# Patient Record
Sex: Female | Born: 1960 | Race: White | Hispanic: No | Marital: Married | Smoking: Never smoker
Health system: Southern US, Community
[De-identification: ages and names within clinical notes are randomized; demographics above are authoritative.]

## PROBLEM LIST (undated history)

## (undated) DIAGNOSIS — R519 Headache, unspecified: Secondary | ICD-10-CM

## (undated) DIAGNOSIS — F329 Major depressive disorder, single episode, unspecified: Secondary | ICD-10-CM

## (undated) DIAGNOSIS — F32A Depression, unspecified: Secondary | ICD-10-CM

## (undated) DIAGNOSIS — I2699 Other pulmonary embolism without acute cor pulmonale: Secondary | ICD-10-CM

## (undated) DIAGNOSIS — R51 Headache: Secondary | ICD-10-CM

## (undated) DIAGNOSIS — D6862 Lupus anticoagulant syndrome: Secondary | ICD-10-CM

## (undated) DIAGNOSIS — F419 Anxiety disorder, unspecified: Secondary | ICD-10-CM

## (undated) DIAGNOSIS — E119 Type 2 diabetes mellitus without complications: Secondary | ICD-10-CM

## (undated) DIAGNOSIS — F4310 Post-traumatic stress disorder, unspecified: Secondary | ICD-10-CM

## (undated) DIAGNOSIS — G8929 Other chronic pain: Secondary | ICD-10-CM

## (undated) DIAGNOSIS — D6852 Prothrombin gene mutation: Secondary | ICD-10-CM

## (undated) HISTORY — DX: Other chronic pain: G89.29

## (undated) HISTORY — DX: Headache, unspecified: R51.9

## (undated) HISTORY — DX: Headache: R51

## (undated) HISTORY — DX: Major depressive disorder, single episode, unspecified: F32.9

## (undated) HISTORY — DX: Anxiety disorder, unspecified: F41.9

## (undated) HISTORY — DX: Post-traumatic stress disorder, unspecified: F43.10

## (undated) HISTORY — PX: TONSILLECTOMY AND ADENOIDECTOMY: SHX28

## (undated) HISTORY — DX: Other pulmonary embolism without acute cor pulmonale: I26.99

## (undated) HISTORY — PX: OTHER SURGICAL HISTORY: SHX169

## (undated) HISTORY — PX: KNEE ARTHROSCOPY: SHX127

## (undated) HISTORY — DX: Type 2 diabetes mellitus without complications: E11.9

## (undated) HISTORY — DX: Depression, unspecified: F32.A

---

## 1999-08-16 ENCOUNTER — Encounter: Payer: Self-pay | Admitting: Emergency Medicine

## 1999-08-16 ENCOUNTER — Emergency Department (HOSPITAL_COMMUNITY): Admission: EM | Admit: 1999-08-16 | Discharge: 1999-08-16 | Payer: Self-pay | Admitting: Emergency Medicine

## 1999-11-09 ENCOUNTER — Ambulatory Visit (HOSPITAL_COMMUNITY): Admission: RE | Admit: 1999-11-09 | Discharge: 1999-11-09 | Payer: Self-pay | Admitting: Gastroenterology

## 2001-07-19 ENCOUNTER — Encounter: Admission: RE | Admit: 2001-07-19 | Discharge: 2001-09-07 | Payer: Self-pay | Admitting: Rheumatology

## 2002-02-13 ENCOUNTER — Encounter: Admission: RE | Admit: 2002-02-13 | Discharge: 2002-02-13 | Payer: Self-pay | Admitting: Family Medicine

## 2002-02-13 ENCOUNTER — Encounter: Payer: Self-pay | Admitting: Family Medicine

## 2003-10-28 ENCOUNTER — Encounter: Admission: RE | Admit: 2003-10-28 | Discharge: 2003-10-28 | Payer: Self-pay | Admitting: Family Medicine

## 2003-11-03 ENCOUNTER — Encounter: Admission: RE | Admit: 2003-11-03 | Discharge: 2003-11-03 | Payer: Self-pay | Admitting: Family Medicine

## 2005-11-23 ENCOUNTER — Emergency Department (HOSPITAL_COMMUNITY): Admission: EM | Admit: 2005-11-23 | Discharge: 2005-11-23 | Payer: Self-pay | Admitting: Emergency Medicine

## 2005-12-03 ENCOUNTER — Emergency Department (HOSPITAL_COMMUNITY): Admission: EM | Admit: 2005-12-03 | Discharge: 2005-12-03 | Payer: Self-pay | Admitting: Emergency Medicine

## 2005-12-22 ENCOUNTER — Encounter: Admission: RE | Admit: 2005-12-22 | Discharge: 2005-12-22 | Payer: Self-pay | Admitting: Specialist

## 2008-09-01 ENCOUNTER — Emergency Department (HOSPITAL_COMMUNITY): Admission: EM | Admit: 2008-09-01 | Discharge: 2008-09-01 | Payer: Self-pay | Admitting: Emergency Medicine

## 2008-09-02 ENCOUNTER — Encounter: Admission: RE | Admit: 2008-09-02 | Discharge: 2008-09-02 | Payer: Self-pay | Admitting: Family Medicine

## 2008-09-03 ENCOUNTER — Encounter: Admission: RE | Admit: 2008-09-03 | Discharge: 2008-09-03 | Payer: Self-pay | Admitting: Family Medicine

## 2010-11-22 LAB — CBC
HCT: 42.1 % (ref 36.0–46.0)
Hemoglobin: 14.2 g/dL (ref 12.0–15.0)
MCHC: 33.8 g/dL (ref 30.0–36.0)
MCV: 95 fL (ref 78.0–100.0)
Platelets: 244 10*3/uL (ref 150–400)
RDW: 13.5 % (ref 11.5–15.5)

## 2010-11-22 LAB — COMPREHENSIVE METABOLIC PANEL
AST: 29 U/L (ref 0–37)
Albumin: 4.4 g/dL (ref 3.5–5.2)
Calcium: 9.6 mg/dL (ref 8.4–10.5)
Creatinine, Ser: 0.89 mg/dL (ref 0.4–1.2)
GFR calc non Af Amer: >60 mL/min (ref >60)
Glucose, Bld: 98 mg/dL (ref 70–99)
Total Protein: 6.9 g/dL (ref 6.0–8.3)

## 2010-11-22 LAB — DIFFERENTIAL
Basophils Absolute: 0 10*3/uL (ref 0.0–0.1)
Eosinophils Absolute: 0 10*3/uL (ref 0.0–0.7)
Lymphs Abs: 1.2 10*3/uL (ref 0.7–4.0)
Monocytes Absolute: 0.6 10*3/uL (ref 0.1–1.0)
Neutrophils Relative %: 66 % (ref 43–77)

## 2011-02-17 ENCOUNTER — Ambulatory Visit (HOSPITAL_COMMUNITY)
Admission: RE | Admit: 2011-02-17 | Discharge: 2011-02-17 | Disposition: A | Discharge status: Home / Self Care | Payer: 59 | Attending: Psychiatry | Admitting: Psychiatry

## 2011-02-17 DIAGNOSIS — F331 Major depressive disorder, recurrent, moderate: Secondary | ICD-10-CM | POA: Insufficient documentation

## 2011-02-18 ENCOUNTER — Other Ambulatory Visit (HOSPITAL_COMMUNITY): Payer: 59 | Admitting: Psychiatry

## 2011-02-21 ENCOUNTER — Other Ambulatory Visit (HOSPITAL_COMMUNITY): Payer: 59 | Attending: Psychiatry | Admitting: Psychiatry

## 2011-02-21 DIAGNOSIS — G43909 Migraine, unspecified, not intractable, without status migrainosus: Secondary | ICD-10-CM | POA: Insufficient documentation

## 2011-02-21 DIAGNOSIS — K219 Gastro-esophageal reflux disease without esophagitis: Secondary | ICD-10-CM | POA: Insufficient documentation

## 2011-02-21 DIAGNOSIS — Z6379 Other stressful life events affecting family and household: Secondary | ICD-10-CM | POA: Insufficient documentation

## 2011-02-21 DIAGNOSIS — E669 Obesity, unspecified: Secondary | ICD-10-CM | POA: Insufficient documentation

## 2011-02-21 DIAGNOSIS — F4310 Post-traumatic stress disorder, unspecified: Secondary | ICD-10-CM | POA: Insufficient documentation

## 2011-02-22 ENCOUNTER — Other Ambulatory Visit (HOSPITAL_COMMUNITY): Payer: 59 | Attending: Psychiatry | Admitting: Psychiatry

## 2011-02-22 DIAGNOSIS — F4310 Post-traumatic stress disorder, unspecified: Secondary | ICD-10-CM | POA: Insufficient documentation

## 2011-02-22 DIAGNOSIS — IMO0002 Reserved for concepts with insufficient information to code with codable children: Secondary | ICD-10-CM | POA: Insufficient documentation

## 2011-02-22 DIAGNOSIS — Z6379 Other stressful life events affecting family and household: Secondary | ICD-10-CM | POA: Insufficient documentation

## 2011-02-22 DIAGNOSIS — G43909 Migraine, unspecified, not intractable, without status migrainosus: Secondary | ICD-10-CM | POA: Insufficient documentation

## 2011-02-22 DIAGNOSIS — E669 Obesity, unspecified: Secondary | ICD-10-CM | POA: Insufficient documentation

## 2011-02-22 DIAGNOSIS — K219 Gastro-esophageal reflux disease without esophagitis: Secondary | ICD-10-CM | POA: Insufficient documentation

## 2011-02-23 ENCOUNTER — Other Ambulatory Visit (HOSPITAL_COMMUNITY): Payer: 59 | Admitting: Psychiatry

## 2011-02-24 ENCOUNTER — Other Ambulatory Visit (HOSPITAL_COMMUNITY): Payer: 59 | Admitting: Psychiatry

## 2011-02-25 ENCOUNTER — Other Ambulatory Visit (HOSPITAL_COMMUNITY): Payer: 59 | Admitting: Psychiatry

## 2011-02-28 ENCOUNTER — Other Ambulatory Visit (HOSPITAL_COMMUNITY): Payer: 59 | Admitting: Psychiatry

## 2011-03-01 ENCOUNTER — Other Ambulatory Visit (HOSPITAL_COMMUNITY): Payer: 59 | Admitting: Psychiatry

## 2011-03-02 ENCOUNTER — Other Ambulatory Visit (HOSPITAL_COMMUNITY): Payer: 59 | Admitting: Psychiatry

## 2011-03-03 ENCOUNTER — Other Ambulatory Visit (HOSPITAL_COMMUNITY): Payer: 59 | Admitting: Psychiatry

## 2011-03-04 ENCOUNTER — Other Ambulatory Visit (HOSPITAL_COMMUNITY): Payer: 59 | Admitting: Psychiatry

## 2011-03-07 ENCOUNTER — Other Ambulatory Visit (HOSPITAL_COMMUNITY): Payer: 59 | Admitting: Psychiatry

## 2011-03-09 ENCOUNTER — Other Ambulatory Visit (HOSPITAL_COMMUNITY): Payer: 59 | Admitting: Psychiatry

## 2011-03-11 ENCOUNTER — Other Ambulatory Visit (HOSPITAL_COMMUNITY): Payer: 59 | Admitting: Psychiatry

## 2011-03-14 ENCOUNTER — Other Ambulatory Visit (HOSPITAL_COMMUNITY): Payer: 59 | Admitting: Psychiatry

## 2011-03-16 ENCOUNTER — Other Ambulatory Visit (HOSPITAL_COMMUNITY): Payer: 59 | Admitting: Psychiatry

## 2011-03-18 ENCOUNTER — Other Ambulatory Visit (HOSPITAL_COMMUNITY): Payer: 59 | Admitting: Psychiatry

## 2011-03-21 ENCOUNTER — Other Ambulatory Visit (HOSPITAL_COMMUNITY): Payer: 59 | Admitting: Psychiatry

## 2011-03-23 ENCOUNTER — Other Ambulatory Visit (HOSPITAL_COMMUNITY): Payer: 59 | Admitting: Psychiatry

## 2011-03-25 ENCOUNTER — Other Ambulatory Visit (HOSPITAL_COMMUNITY): Payer: 59 | Admitting: Psychiatry

## 2011-03-28 ENCOUNTER — Other Ambulatory Visit (HOSPITAL_COMMUNITY): Payer: 59 | Admitting: Psychiatry

## 2011-03-30 ENCOUNTER — Other Ambulatory Visit (HOSPITAL_COMMUNITY): Payer: 59 | Admitting: Psychiatry

## 2011-04-01 ENCOUNTER — Other Ambulatory Visit (HOSPITAL_COMMUNITY): Payer: 59 | Admitting: Psychiatry

## 2011-04-04 ENCOUNTER — Other Ambulatory Visit (HOSPITAL_COMMUNITY): Payer: 59 | Admitting: Psychiatry

## 2011-04-09 DIAGNOSIS — I2699 Other pulmonary embolism without acute cor pulmonale: Secondary | ICD-10-CM

## 2011-04-09 HISTORY — DX: Other pulmonary embolism without acute cor pulmonale: I26.99

## 2011-05-13 LAB — PULMONARY FUNCTION TEST

## 2011-06-14 ENCOUNTER — Other Ambulatory Visit: Payer: Self-pay | Admitting: Rheumatology

## 2011-06-14 DIAGNOSIS — M545 Low back pain, unspecified: Secondary | ICD-10-CM

## 2011-06-15 ENCOUNTER — Other Ambulatory Visit: Payer: Self-pay | Admitting: Rheumatology

## 2011-06-15 ENCOUNTER — Other Ambulatory Visit: Payer: 59

## 2011-06-15 DIAGNOSIS — M533 Sacrococcygeal disorders, not elsewhere classified: Secondary | ICD-10-CM

## 2011-06-17 ENCOUNTER — Ambulatory Visit
Admission: RE | Admit: 2011-06-17 | Discharge: 2011-06-17 | Disposition: A | Discharge status: Home / Self Care | Payer: 59 | Source: Ambulatory Visit | Attending: Rheumatology | Admitting: Rheumatology

## 2011-06-17 DIAGNOSIS — M533 Sacrococcygeal disorders, not elsewhere classified: Secondary | ICD-10-CM

## 2011-07-14 ENCOUNTER — Encounter: Payer: Self-pay | Admitting: Emergency Medicine

## 2011-07-14 ENCOUNTER — Emergency Department (HOSPITAL_COMMUNITY): Admission: EM | Admit: 2011-07-14 | Payer: 59 | Source: Home / Self Care

## 2011-07-14 ENCOUNTER — Emergency Department (HOSPITAL_COMMUNITY)
Admission: EM | Admit: 2011-07-14 | Discharge: 2011-07-15 | Disposition: A | Discharge status: Home / Self Care | Payer: 59 | Attending: Emergency Medicine | Admitting: Emergency Medicine

## 2011-07-14 ENCOUNTER — Ambulatory Visit (HOSPITAL_COMMUNITY)
Admission: RE | Admit: 2011-07-14 | Discharge: 2011-07-14 | Disposition: A | Discharge status: Home / Self Care | Payer: 59 | Source: Ambulatory Visit | Attending: Rheumatology | Admitting: Rheumatology

## 2011-07-14 ENCOUNTER — Other Ambulatory Visit (HOSPITAL_COMMUNITY): Payer: Self-pay | Admitting: Physician Assistant

## 2011-07-14 DIAGNOSIS — Z7901 Long term (current) use of anticoagulants: Secondary | ICD-10-CM | POA: Insufficient documentation

## 2011-07-14 DIAGNOSIS — Z86711 Personal history of pulmonary embolism: Secondary | ICD-10-CM | POA: Insufficient documentation

## 2011-07-14 DIAGNOSIS — R0602 Shortness of breath: Secondary | ICD-10-CM

## 2011-07-14 DIAGNOSIS — J90 Pleural effusion, not elsewhere classified: Secondary | ICD-10-CM | POA: Insufficient documentation

## 2011-07-14 DIAGNOSIS — R059 Cough, unspecified: Secondary | ICD-10-CM | POA: Insufficient documentation

## 2011-07-14 DIAGNOSIS — Z86718 Personal history of other venous thrombosis and embolism: Secondary | ICD-10-CM | POA: Insufficient documentation

## 2011-07-14 DIAGNOSIS — M545 Low back pain, unspecified: Secondary | ICD-10-CM

## 2011-07-14 DIAGNOSIS — R079 Chest pain, unspecified: Secondary | ICD-10-CM | POA: Insufficient documentation

## 2011-07-14 DIAGNOSIS — J9819 Other pulmonary collapse: Secondary | ICD-10-CM | POA: Insufficient documentation

## 2011-07-14 DIAGNOSIS — R05 Cough: Secondary | ICD-10-CM | POA: Insufficient documentation

## 2011-07-14 HISTORY — DX: Prothrombin gene mutation: D68.52

## 2011-07-14 HISTORY — DX: Lupus anticoagulant syndrome: D68.62

## 2011-07-14 LAB — COMPREHENSIVE METABOLIC PANEL
Calcium: 9.3 mg/dL (ref 8.4–10.5)
GFR calc Af Amer: 71 mL/min — ABNORMAL LOW (ref >90)
GFR calc non Af Amer: 61 mL/min — ABNORMAL LOW (ref >90)
Potassium: 3.2 mEq/L — ABNORMAL LOW (ref 3.5–5.1)
Total Bilirubin: 0.3 mg/dL (ref 0.3–1.2)

## 2011-07-14 LAB — DIFFERENTIAL
Basophils Absolute: 0 10*3/uL (ref 0.0–0.1)
Eosinophils Relative: 2 % (ref 0–5)
Lymphocytes Relative: 24 % (ref 12–46)
Monocytes Relative: 8 % (ref 3–12)
Neutro Abs: 3.7 10*3/uL (ref 1.7–7.7)

## 2011-07-14 LAB — CBC
HCT: 38.1 % (ref 36.0–46.0)
Hemoglobin: 12.8 g/dL (ref 12.0–15.0)
MCH: 30 pg (ref 26.0–34.0)
MCV: 89.2 fL (ref 78.0–100.0)
RDW: 13.7 % (ref 11.5–15.5)
WBC: 5.7 10*3/uL (ref 4.0–10.5)

## 2011-07-14 LAB — PROTIME-INR: INR: 1.88 — ABNORMAL HIGH (ref 0.00–1.49)

## 2011-07-14 MED ORDER — IOHEXOL 300 MG/ML  SOLN
100.0000 mL | Freq: Once | INTRAMUSCULAR | Status: AC | PRN
  Administered 2011-07-14: 80 mL via INTRAVENOUS

## 2011-07-14 MED ORDER — ONDANSETRON HCL 4 MG/2ML IJ SOLN
4.0000 mg | Freq: Once | INTRAMUSCULAR | Status: AC
  Administered 2011-07-14: 4 mg via INTRAVENOUS
  Filled 2011-07-14: qty 2

## 2011-07-14 MED ORDER — ALBUTEROL SULFATE (5 MG/ML) 0.5% IN NEBU
5.0000 mg | INHALATION_SOLUTION | Freq: Once | RESPIRATORY_TRACT | Status: AC
  Administered 2011-07-14: 5 mg via RESPIRATORY_TRACT
  Filled 2011-07-14: qty 1

## 2011-07-14 MED ORDER — MORPHINE SULFATE 4 MG/ML IJ SOLN: 4.0000 mg | Freq: Once | INTRAMUSCULAR | Status: DC

## 2011-07-14 MED ORDER — IPRATROPIUM BROMIDE 0.02 % IN SOLN
0.5000 mg | Freq: Once | RESPIRATORY_TRACT | Status: AC
  Administered 2011-07-14: 0.5 mg via RESPIRATORY_TRACT
  Filled 2011-07-14: qty 2.5

## 2011-07-14 MED ORDER — MORPHINE SULFATE 2 MG/ML IJ SOLN
INTRAMUSCULAR | Status: AC
  Administered 2011-07-14: 2 mg via INTRAVENOUS
  Filled 2011-07-14: qty 2

## 2011-07-14 NOTE — ED Provider Notes (Signed)
History     CSN: [id number] Arrival date & time: 07/14/2011  9:16 PM   None     Chief Complaint  Patient presents with  . Shortness of Breath    HPI  History provided by the patient and family. Patient with significant history of lupus, pulmonary embolism, and asthma who presents with complaints of shortness of breath, and chest pain that began yesterday. Patient called PCP and was seen and evaluated today in the office for symptoms. Patient states that symptoms had a similar presentation to her prior pulmonary embolism. Pain is located in the right lower front chest and side as well as the left lateral chest side. Pain is achy. Symptoms are worse with deep breath. Patient has slight cough and shortness of breath. Her PCP was concerned and sent her here for a CT scan of her chest. After receiving the scan patient was advised to come to the emergency room for further evaluation of symptoms. She has no other significant past medical history.    Past Medical History  Diagnosis Date  . Asthma   . 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 Lupus anticoagulant syndrome     Past Surgical History  Procedure Date  . Tonsillectomy and adenoidectomy     History reviewed. No pertinent family history.  History  Substance Use Topics  . Smoking status: Not on file  . Smokeless tobacco: Not on file  . Alcohol Use:     OB History    Grav Para Term Preterm Abortions TAB SAB Ect Mult Living                  Review of Systems  Constitutional: Negative for fever and chills.  Respiratory: Positive for cough and shortness of breath.   Cardiovascular: Positive for chest pain.  Gastrointestinal: Positive for nausea. Negative for vomiting, abdominal pain, diarrhea and constipation.  Genitourinary: Negative for dysuria, frequency, hematuria, flank pain, vaginal bleeding and vaginal discharge.  All other systems reviewed and are negative.    Allergies  Geodon and Vicodin  Home Medications    Current Outpatient Rx  Name Route Sig Dispense Refill  . BACLOFEN 10 MG PO TABS Oral Take 10 mg by mouth daily.      Marland Kitchen CLONAZEPAM 1 MG PO TABS Oral Take 1 mg by mouth at bedtime as needed. For sleep.     Marland Kitchen OMEPRAZOLE 40 MG PO CPDR Oral Take 40 mg by mouth daily.      . QUETIAPINE FUMARATE 100 MG PO TABS Oral Take 100 mg by mouth at bedtime.      . SOLIFENACIN SUCCINATE 10 MG PO TABS Oral Take 10 mg by mouth daily.      . SUMATRIPTAN SUCCINATE 100 MG PO TABS Oral Take 100 mg by mouth every 2 (two) hours as needed. For migraines.     . TOPIRAMATE 200 MG PO TABS Oral Take 200 mg by mouth daily.      . TRAZODONE HCL 300 MG PO TB24 Oral Take 1 tablet by mouth daily.      . WARFARIN SODIUM 5 MG PO TABS Oral Take 5 mg by mouth daily.        BP 116/76  Pulse 81  Temp(Src) 98 F (36.7 C) (Oral)  Resp 16  Wt 200 lb (90.719 kg)  SpO2 99%  Physical Exam  Nursing note and vitals reviewed. Constitutional: She is oriented to person, place, and time. She appears well-developed and well-nourished. No distress.  HENT:  Head:  Normocephalic.  Mouth/Throat: Oropharynx is clear and moist.  Eyes: Conjunctivae and EOM are normal.  Neck: Normal range of motion. Neck supple.  Cardiovascular: Normal rate.   Pulmonary/Chest: Effort normal and breath sounds normal. No respiratory distress. She has no wheezes. She has no rales. She exhibits no tenderness.  Abdominal: Soft. Bowel sounds are normal. There is tenderness in the right upper quadrant. There is no rebound, no guarding, no CVA tenderness, no tenderness at McBurney's point and negative Murphy's sign.  Musculoskeletal: She exhibits no edema and no tenderness.  Neurological: She is alert and oriented to person, place, and time.  Skin: Skin is warm. No rash noted.  Psychiatric: She has a normal mood and affect. Her behavior is normal.    ED Course  Procedures (including critical care time)  Labs Reviewed  PROTIME-INR - Abnormal; Notable for the  following:    Prothrombin Time 21.9 (*)    INR 1.88 (*)    All other components within normal limits  COMPREHENSIVE METABOLIC PANEL - Abnormal; Notable for the following:    Potassium 3.2 (*)    GFR calc non Af Amer 61 (*)    GFR calc Af Amer 71 (*)    All other components within normal limits  CBC  DIFFERENTIAL  LIPASE, BLOOD   Results for orders placed during the hospital encounter of 07/14/11  CBC      Component Value Range   WBC 5.7  4.0 - 10.5 (K/uL)   RBC 4.27  3.87 - 5.11 (MIL/uL)   Hemoglobin 12.8  12.0 - 15.0 (g/dL)   HCT 09.8  11.9 - 14.7 (%)   MCV 89.2  78.0 - 100.0 (fL)   MCH 30.0  26.0 - 34.0 (pg)   MCHC 33.6  30.0 - 36.0 (g/dL)   RDW 82.9  56.2 - 13.0 (%)   Platelets 193  150 - 400 (K/uL)  DIFFERENTIAL      Component Value Range   Neutrophils Relative 66  43 - 77 (%)   Neutro Abs 3.7  1.7 - 7.7 (K/uL)   Lymphocytes Relative 24  12 - 46 (%)   Lymphs Abs 1.3  0.7 - 4.0 (K/uL)   Monocytes Relative 8  3 - 12 (%)   Monocytes Absolute 0.5  0.1 - 1.0 (K/uL)   Eosinophils Relative 2  0 - 5 (%)   Eosinophils Absolute 0.1  0.0 - 0.7 (K/uL)   Basophils Relative 0  0 - 1 (%)   Basophils Absolute 0.0  0.0 - 0.1 (K/uL)  PROTIME-INR      Component Value Range   Prothrombin Time 21.9 (*) 11.6 - 15.2 (seconds)   INR 1.88 (*) 0.00 - 1.49   COMPREHENSIVE METABOLIC PANEL      Component Value Range   Sodium 138  135 - 145 (mEq/L)   Potassium 3.2 (*) 3.5 - 5.1 (mEq/L)   Chloride 106  96 - 112 (mEq/L)   CO2 23  19 - 32 (mEq/L)   Glucose, Bld 97  70 - 99 (mg/dL)   BUN 15  6 - 23 (mg/dL)   Creatinine, Ser 8.65  0.50 - 1.10 (mg/dL)   Calcium 9.3  8.4 - 78.4 (mg/dL)   Total Protein 7.1  6.0 - 8.3 (g/dL)   Albumin 3.8  3.5 - 5.2 (g/dL)   AST 10  0 - 37 (U/L)   ALT 9  0 - 35 (U/L)   Alkaline Phosphatase 88  39 - 117 (U/L)   Total  Bilirubin 0.3  0.3 - 1.2 (mg/dL)   GFR calc non Af Amer 61 (*) >90 (mL/min)   GFR calc Af Amer 71 (*) >90 (mL/min)  LIPASE, BLOOD      Component  Value Range   Lipase 20  11 - 59 (U/L)      Ct Angio Chest W/cm &/or Wo Cm  07/14/2011  *RADIOLOGY REPORT*  Clinical Data:  Progressive shortness of breath.  History of pulmonary embolism.  CT ANGIOGRAPHY CHEST WITH CONTRAST  Technique:  Multidetector CT imaging of the chest was performed using the standard protocol during bolus administration of intravenous contrast.  Multiplanar CT image reconstructions including MIPs were obtained to evaluate the vascular anatomy.  Contrast: 80mL OMNIPAQUE IOHEXOL 300 MG/ML IV SOLN  Comparison:  Chest radiographs 06/09/2007.  No prior chest CT.  Findings:  The pulmonary arteries are well opacified with contrast. There is no evidence of acute pulmonary embolism.  The thoracic aorta appears normal.  There are no enlarged mediastinal or hilar lymph nodes. A small hiatal hernia is noted.  Minimal pleural fluid is present posteriorly at the right costophrenic angle.  There is no left pleural or pericardial effusion.  Mild linear atelectasis is present in both lower lobes and in the right middle lobe.  There is no confluent airspace opacity, endobronchial lesion or suspicious nodule.  The visualized upper abdomen appears unremarkable.  There are no acute osseous findings.  Review of the MIP images confirms the above findings.  IMPRESSION:  1.  No evidence of acute pulmonary embolism. 2.  Mild bibasilar atelectasis and small right pleural effusion.  Original Report Authenticated By: Gerrianne Scale, M.D.     1. Shortness of breath       MDM  9:50 PM patient seen and evaluated. Patient no acute distress.   Pt continues to do well in no respiratory distress and good O2 sats.  Pt's labs, and ECG have been unremarkable.  Pt had no signs for PE on CT angio done earlier today.  At this time pt is felt safe to return home to continue follow up with PCP.  Pt agrees with plan.    Date: 07/14/2011  Rate: 73  Rhythm: normal sinus rhythm  QRS Axis: normal  Intervals:  normal  ST/T Wave abnormalities: normal  Conduction Disutrbances:none  Narrative Interpretation:   Old EKG Reviewed: none available    Angus Seller, PA 07/15/11 1921

## 2011-07-14 NOTE — ED Notes (Signed)
Pt alert, nad, presents to ED, c/o SOB, onset yesterday, seen PCP, had CT tonight, instructed to come to Ed after CT exam, resp even unlabored

## 2011-07-15 LAB — POCT I-STAT TROPONIN I: Troponin i, poc: 0 ng/mL (ref 0.00–0.08)

## 2011-07-15 MED ORDER — METHYLPREDNISOLONE SODIUM SUCC 125 MG IJ SOLR
125.0000 mg | Freq: Once | INTRAMUSCULAR | Status: AC
  Administered 2011-07-15: 125 mg via INTRAVENOUS
  Filled 2011-07-15: qty 2

## 2011-07-18 NOTE — ED Provider Notes (Signed)
Medical screening examination/treatment/procedure(s) were performed by non-physician practitioner and as supervising physician I was immediately available for consultation/collaboration.   Liv Rallis A. Patrica Duel, MD 07/18/11 [phone]

## 2011-07-26 ENCOUNTER — Encounter: Payer: Self-pay | Admitting: Pulmonary Disease

## 2011-07-27 ENCOUNTER — Ambulatory Visit (INDEPENDENT_AMBULATORY_CARE_PROVIDER_SITE_OTHER): Payer: 59 | Admitting: Internal Medicine

## 2011-07-27 ENCOUNTER — Encounter: Payer: Self-pay | Admitting: Internal Medicine

## 2011-07-27 VITALS — BP 100/68 | HR 97 | Temp 98.7°F | Ht 65.0 in | Wt 205.0 lb

## 2011-07-27 DIAGNOSIS — I2699 Other pulmonary embolism without acute cor pulmonale: Secondary | ICD-10-CM

## 2011-07-27 DIAGNOSIS — R0989 Other specified symptoms and signs involving the circulatory and respiratory systems: Secondary | ICD-10-CM

## 2011-07-27 DIAGNOSIS — R0602 Shortness of breath: Secondary | ICD-10-CM

## 2011-07-27 DIAGNOSIS — R0600 Dyspnea, unspecified: Secondary | ICD-10-CM

## 2011-07-27 DIAGNOSIS — R0609 Other forms of dyspnea: Secondary | ICD-10-CM

## 2011-07-27 NOTE — Progress Notes (Signed)
  Subjective:    Patient ID: Allison [name], female    DOB: Oct 25, 1960, 50 y.o.   MRN: [id number]  HPI  65 yowf never smoker, never trouble with breathing as a child, new onset  Sob 04/2011  eval by [facility] Chest early September "on a Friday" and by Saturday assoc with with cp dx pe> admitted Kathryne Sharper some better by discharge then lost ground in terms of sob  and referred by Dr Collins [place] 07/27/2011 for second opinion.  07/14/11 eval in [facility] ER with nl labs, no evidence PE on ct angiogram, very small R effusion  07/27/2011 1st pulmonary eval for sob not present sitting, not sleeping, doe x 100 yards but highly variable,  Assoc with hoarseness, not reproducible in office on day of ov, assoc with minimal dry cough and dysphagia despite ppi daily. No assoc focal leg swelling/ pain or dx of dvt  Sleeping ok without nocturnal  or early am exacerbation  of respiratory  c/o's or need for noct saba. Also denies any obvious fluctuation of symptoms with weather or environmental changes or other aggravating or alleviating factors except as outlined above    pft's and echo at [facility] per pt  proaire or neb > no better.    Review of Systems  Constitutional: Negative for fever, chills and unexpected weight change.  HENT: Positive for trouble swallowing. Negative for ear pain, nosebleeds, congestion, sore throat, rhinorrhea, sneezing, dental problem, voice change, postnasal drip and sinus pressure.   Eyes: Negative for visual disturbance.  Respiratory: Positive for cough and shortness of breath. Negative for choking.   Cardiovascular: Positive for chest pain and leg swelling.  Gastrointestinal: Positive for abdominal pain. Negative for vomiting and diarrhea.  Genitourinary: Negative for difficulty urinating.  Musculoskeletal: Negative for arthralgias.  Skin: Negative for rash.  Neurological: Positive for headaches. Negative for tremors and syncope.  Hematological: Does not bruise/bleed easily.    Psychiatric/Behavioral: The patient is nervous/anxious.        Objective:   Physical Exam amb obese hoarse wf nad with classic pseudowheeze and very flat affect  Wt 205 07/27/11  HEENT: nl dentition, turbinates, and orophanx. Nl external ear canals without cough reflex   NECK :  without JVD/Nodes/TM/ nl carotid upstrokes bilaterally   LUNGS: no acc muscle use, clear to A and P bilaterally without cough on insp or exp maneuvers   CV:  RRR  no s3 or murmur or increase in P2, no edema   ABD:  soft and nontender with nl excursion in the supine position. No bruits or organomegaly, bowel sounds nl  MS:  warm without deformities, calf tenderness, cyanosis or clubbing  SKIN: warm and dry without lesions    NEURO:  alert, approp, no deficits         Assessment & Plan:

## 2011-07-27 NOTE — Patient Instructions (Addendum)
Continue omeprazole 40 mg dosed 30-60 min before eating  Pepcid ac 20 mg one at bedtime  GERD (REFLUX)  is an extremely common cause of respiratory symptoms, many times with no significant heartburn at all.    It can be treated with medication, but also with lifestyle changes including avoidance of late meals, excessive alcohol, smoking cessation, and avoid fatty foods, chocolate, peppermint, colas, red wine, and acidic juices such as orange juice.  NO MINT OR MENTHOL PRODUCTS SO NO COUGH DROPS  USE SUGARLESS CANDY INSTEAD (jolley ranchers or Stover's)  NO OIL BASED VITAMINS - use powdered substitutes.    Build up to exercising  30 min daily pace yourself whereas short of breath never of out breath.   Please schedule a follow up office visit in 4 weeks, sooner if needed

## 2011-07-28 DIAGNOSIS — I2699 Other pulmonary embolism without acute cor pulmonale: Secondary | ICD-10-CM | POA: Insufficient documentation

## 2011-07-28 DIAGNOSIS — R0600 Dyspnea, unspecified: Secondary | ICD-10-CM | POA: Insufficient documentation

## 2011-07-28 NOTE — Assessment & Plan Note (Addendum)
-  07/27/11 Walked RA x 3 laps @ 185 ft each stopped due to  End of study, no tachycardia or desat and nl spirometry > 4 hour p B2 and p exercise   Symptoms are markedly disproportionate to objective findings and not clear this is a lung problem but pt does appear to have difficult airway management issues.   Anxiety is usually a dx of exclusion but is the leading suspect here, along with obesity and deconditioning  ? Acid or nonacid reflux> always difficult to exclude so rx max acid suppression plus diet/ reviewed  Next step full cpst if not improving

## 2011-07-28 NOTE — Assessment & Plan Note (Signed)
Dx in [place] 04/2011   - CT [facility]  07/14/11  1. No evidence of acute pulmonary embolism.  2. Mild bibasilar atelectasis and small right pleural effusion.  No evidence of PAH or ongoing dvt/pe on coumadin rx per Dr Yehuda Budd

## 2011-08-29 ENCOUNTER — Encounter: Payer: 59 | Admitting: Internal Medicine

## 2011-08-29 NOTE — Progress Notes (Signed)
  Subjective:    Patient ID: Allison [name], female    DOB: Jul 01, 1961, 51 y.o.   MRN: [id number]  HPI  63 yowf never smoker, never trouble with breathing as a child, new onset  Sob 04/2011  eval by [facility] Chest early September "on a Friday" and by Saturday assoc with with cp dx pe> admitted Kathryne Sharper some better by discharge then lost ground in terms of sob  and referred by Dr Collins Scotland 07/27/2011 for second opinion.  07/14/11 eval in [facility] with nl labs, no evidence PE on ct angiogram, very small R effusion  07/27/2011 1st pulmonary eval for sob not present sitting, not sleeping, doe x 100 yards but highly variable,  Assoc with hoarseness, not reproducible in office on day of ov, assoc with minimal dry cough and dysphagia despite ppi daily. No assoc focal leg swelling/ pain or dx of dvt  pft's and echo at [facility] per pt  proaire or neb > no better.   rec Continue omeprazole 40 mg dosed 30-60 min before eating  Pepcid ac 20 mg one at bedtime  GERD     Objective:   Physical Exam amb obese hoarse wf nad with classic pseudowheeze and very flat affect  Wt 205 07/27/11 > 08/29/2011   HEENT: nl dentition, turbinates, and orophanx. Nl external ear canals without cough reflex   NECK :  without JVD/Nodes/TM/ nl carotid upstrokes bilaterally   LUNGS: no acc muscle use, clear to A and P bilaterally without cough on insp or exp maneuvers   CV:  RRR  no s3 or murmur or increase in P2, no edema   ABD:  soft and nontender with nl excursion in the supine position. No bruits or organomegaly, bowel sounds nl  MS:  warm without deformities, calf tenderness, cyanosis or clubbing  SKIN: warm and dry without lesions    NEURO:  alert, approp, no deficits         Assessment & Plan:

## 2011-09-15 ENCOUNTER — Ambulatory Visit (INDEPENDENT_AMBULATORY_CARE_PROVIDER_SITE_OTHER): Payer: 59 | Admitting: Internal Medicine

## 2011-09-15 ENCOUNTER — Encounter: Payer: Self-pay | Admitting: Internal Medicine

## 2011-09-15 VITALS — BP 110/72 | HR 74 | Temp 97.9°F | Ht 65.0 in | Wt 205.8 lb

## 2011-09-15 DIAGNOSIS — I2699 Other pulmonary embolism without acute cor pulmonale: Secondary | ICD-10-CM

## 2011-09-15 DIAGNOSIS — R0600 Dyspnea, unspecified: Secondary | ICD-10-CM

## 2011-09-15 DIAGNOSIS — R0609 Other forms of dyspnea: Secondary | ICD-10-CM

## 2011-09-15 DIAGNOSIS — R0989 Other specified symptoms and signs involving the circulatory and respiratory systems: Secondary | ICD-10-CM

## 2011-09-15 NOTE — Progress Notes (Signed)
  Subjective:    Patient ID: Allison [name], female    DOB: 06-12-1961    MRN: [id number]  HPI  33 yowf never smoker, never trouble with breathing as a child, new onset  Sob 04/2011  eval by [facility] Chest early September "on a Friday" and by Saturday assoc with with cp dx pe> admitted Kathryne Sharper some better by discharge then lost ground in terms of sob  and referred by Dr Collins Scotland 07/27/2011 for second opinion.  07/14/11 eval in [facility] with nl labs, no evidence PE on ct angiogram, very small R effusion  07/27/2011 1st pulmonary eval for sob not present sitting, not sleeping, doe x 100 yards but highly variable,  Assoc with hoarseness, not reproducible in office on day of ov, assoc with minimal dry cough and dysphagia despite ppi daily. No assoc focal leg swelling/ pain or dx of dvt pft's and echo at [facility] per pt proaire or neb > no better.  rec Continue omeprazole 40 mg dosed 30-60 min before eating Pepcid ac 20 mg one at bedtime GERD diet  09/15/2011 f/u ov/Allison [name] cc no change in doe x 100 yards, still variable some better with clonazepam. No cough.  Sleeping ok without nocturnal  or early am exacerbation  of respiratory  c/o's or need for noct saba. Also denies any obvious fluctuation of symptoms with weather or environmental changes or other aggravating or alleviating factors except as outlined above   ROS  At present neg for  any significant sore throat, dysphagia, itching, sneezing,  nasal congestion or excess/ purulent secretions,  fever, chills, sweats, unintended wt loss, pleuritic or exertional cp, hempoptysis, orthopnea pnd or leg swelling.  Also denies presyncope, palpitations, heartburn, abdominal pain, nausea, vomiting, diarrhea  or change in bowel or urinary habits, dysuria,hematuria,  rash, arthralgias, visual complaints, headache, numbness weakness or ataxia.         Objective:   Physical Exam amb obese hoarse wf nad with classic pseudowheeze and very flat affect  Wt  205 07/27/11 >  09/15/11 205  HEENT: nl dentition, turbinates, and orophanx. Nl external ear canals without cough reflex   NECK :  without JVD/Nodes/TM/ nl carotid upstrokes bilaterally   LUNGS: no acc muscle use, clear to A and P bilaterally without cough on insp or exp maneuvers   CV:  RRR  no s3 or murmur or increase in P2, no edema   ABD:  soft and nontender with nl excursion in the supine position. No bruits or organomegaly, bowel sounds nl  MS:  warm without deformities, calf tenderness, cyanosis or clubbing     cxr 06/14/11 1. No evidence of acute pulmonary embolism.  2. Mild bibasilar atelectasis and small right pleural effusion.       Assessment & Plan:

## 2011-09-15 NOTE — Patient Instructions (Signed)
Please see patient coordinator before you leave today  to schedule echocardiogram to be sure your heart has recovered from the blood clots  Let Dr Nolen Mu know if your breathing gets worse with tapering your clonazepam  If your echocardiogram is ok the next step in the workup is a formal exercise test at our [facility] street office - I will schedule this after I have a chance to look at your echo first

## 2011-09-18 NOTE — Assessment & Plan Note (Signed)
Spirometry p exercise wnl 07/28/2011  - 09/15/2011  Walked RA x 3 laps @ 185 ft each stopped due to  End of study, no desa  Not able to reproduce her symptoms here in the office strongly suggesting this is anxiety related > next step is formal cpst  See instructions for specific recommendations which were reviewed directly with the patient who was given a copy with highlighter outlining the key components.

## 2011-09-18 NOTE — Assessment & Plan Note (Signed)
CT neg PE 07/14/11 but she could still have TEPAH so rec echo to be complete

## 2011-09-21 ENCOUNTER — Other Ambulatory Visit (HOSPITAL_COMMUNITY): Payer: Self-pay | Admitting: Radiology

## 2011-09-21 DIAGNOSIS — R0600 Dyspnea, unspecified: Secondary | ICD-10-CM

## 2011-09-22 ENCOUNTER — Other Ambulatory Visit: Payer: Self-pay

## 2011-09-22 ENCOUNTER — Ambulatory Visit (HOSPITAL_COMMUNITY): Payer: 59 | Attending: Cardiology | Admitting: Radiology

## 2011-09-22 DIAGNOSIS — R0609 Other forms of dyspnea: Secondary | ICD-10-CM | POA: Insufficient documentation

## 2011-09-22 DIAGNOSIS — R0600 Dyspnea, unspecified: Secondary | ICD-10-CM

## 2011-09-22 DIAGNOSIS — Z86711 Personal history of pulmonary embolism: Secondary | ICD-10-CM | POA: Insufficient documentation

## 2011-09-22 DIAGNOSIS — R0602 Shortness of breath: Secondary | ICD-10-CM

## 2011-09-22 DIAGNOSIS — R0989 Other specified symptoms and signs involving the circulatory and respiratory systems: Secondary | ICD-10-CM | POA: Insufficient documentation

## 2011-09-23 ENCOUNTER — Encounter: Payer: Self-pay | Admitting: Internal Medicine

## 2011-09-23 ENCOUNTER — Telehealth: Payer: Self-pay | Admitting: Internal Medicine

## 2011-09-23 NOTE — Telephone Encounter (Signed)
Dr. Sherene Sires, I called Allison [name] and notified her of ECHO results and to stay on warfarin. She states that her PCP, Herb Grays was thinking of switching warfarin to xarelto. She wanted to see if this is okay with you first. Please advise, thanks!

## 2011-09-23 NOTE — Telephone Encounter (Signed)
Spoke with pt and notified okay per MW to change to the xarelto. Pt verbalized understanding and nothing further needed.

## 2011-09-23 NOTE — Progress Notes (Signed)
Quick Note:  Spoke with pt and notified of results per Dr. Wert. Pt verbalized understanding and denied any questions.  ______ 

## 2011-09-23 NOTE — Telephone Encounter (Signed)
Ok to change  to [employer]

## 2012-08-15 ENCOUNTER — Ambulatory Visit (HOSPITAL_COMMUNITY)
Admission: RE | Admit: 2012-08-15 | Discharge: 2012-08-15 | Disposition: A | Discharge status: Home / Self Care | Payer: 59 | Attending: Psychiatry | Admitting: Psychiatry

## 2012-08-15 DIAGNOSIS — F4310 Post-traumatic stress disorder, unspecified: Secondary | ICD-10-CM | POA: Insufficient documentation

## 2012-08-15 DIAGNOSIS — F332 Major depressive disorder, recurrent severe without psychotic features: Secondary | ICD-10-CM | POA: Insufficient documentation
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
## 2012-08-23 ENCOUNTER — Encounter (HOSPITAL_COMMUNITY): Payer: Self-pay

## 2012-08-23 ENCOUNTER — Other Ambulatory Visit (HOSPITAL_COMMUNITY): Payer: 59 | Attending: Psychiatry | Admitting: Psychiatry

## 2012-08-23 DIAGNOSIS — F4310 Post-traumatic stress disorder, unspecified: Secondary | ICD-10-CM | POA: Insufficient documentation

## 2012-08-23 DIAGNOSIS — F329 Major depressive disorder, single episode, unspecified: Secondary | ICD-10-CM

## 2012-08-23 DIAGNOSIS — F331 Major depressive disorder, recurrent, moderate: Secondary | ICD-10-CM

## 2012-08-23 DIAGNOSIS — F332 Major depressive disorder, recurrent severe without psychotic features: Secondary | ICD-10-CM | POA: Insufficient documentation

## 2012-08-23 DIAGNOSIS — F411 Generalized anxiety disorder: Secondary | ICD-10-CM | POA: Insufficient documentation

## 2012-08-23 DIAGNOSIS — F32A Depression, unspecified: Secondary | ICD-10-CM
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
## 2012-08-24 ENCOUNTER — Other Ambulatory Visit (HOSPITAL_COMMUNITY): Payer: 59 | Admitting: Psychiatry

## 2012-08-24 DIAGNOSIS — F329 Major depressive disorder, single episode, unspecified: Secondary | ICD-10-CM

## 2012-08-24 DIAGNOSIS — F32A Depression, unspecified: Secondary | ICD-10-CM

## 2012-08-24 NOTE — Progress Notes (Signed)
    Daily Group Progress Note  Program: IOP  Group Time: 10:30 am - 12:00 pm   Participation Level: Active  Behavioral Response: Appropriate  Type of Therapy:  Process Group  Summary of Progress:Today was patients first day in the group. She was attentive and observed the group process.     Group Time: 10:30 am - 12:00 pm   Participation Level:  Active  Behavioral Response: Appropriate  Type of Therapy: Psycho-education Group  Summary of Progress: Patient learned a stress management tool (heartmath) and how to use it to reduce stress and maintain wellness.  Carman Ching, LCSW

## 2012-08-24 NOTE — Progress Notes (Signed)
    Daily Group Progress Note  Program: IOP  Group Time: 9:00-10:30 am   Participation Level: Active  Behavioral Response: Appropriate  Type of Therapy:  Process Group  Summary of Progress: Patient participated in a goodbye ceremony for two members ending the group today and expressed feelings associated with them leaving the group and expressed how they had both impacted patient during their time in the program.  Patient learned the skill of healthy boundary setting and how to set healthy limits with others to ensure their own personal wellness.      Group Time: 10:30 am - 12:00 pm   Participation Level:  Active  Behavioral Response: Appropriate  Type of Therapy: Psycho-education Group  Summary of Progress:  Patient learned the skill of healthy boundary setting and how to set healthy limits with others to ensure their own personal wellness.   Carman Ching, LCSW

## 2012-08-27 ENCOUNTER — Other Ambulatory Visit (HOSPITAL_COMMUNITY): Payer: 59 | Admitting: Psychiatry

## 2012-08-27 DIAGNOSIS — F329 Major depressive disorder, single episode, unspecified: Secondary | ICD-10-CM

## 2012-08-27 DIAGNOSIS — F32A Depression, unspecified: Secondary | ICD-10-CM

## 2012-08-27 NOTE — Progress Notes (Signed)
    Daily Group Progress Note  Program: IOP  Group Time: 9:00-10:30 am   Participation Level: Active  Behavioral Response: Appropriate  Type of Therapy:  Process Group  Summary of Progress: Patient reports high depression but has yet to talk in the group. She is struggling to feel comfortable opening up in the group and was encouraged to try sharing her stressors. Patient is struggling with assertiveness and has low self-esteem that prevents her from feeling comfortable asserting her needs. This is a focus of therapy at this time to help her to share.      Group Time: 10:30 am - 12:00 pm   Participation Level:  Active  Behavioral Response: Appropriate  Type of Therapy: Psycho-education Group  Summary of Progress: Patient participated in a group on grief and loss facilitated by Theda Belfast and identified healthy ways of grieving.  Carman Ching, LCSW

## 2012-08-28 ENCOUNTER — Other Ambulatory Visit (HOSPITAL_COMMUNITY): Payer: 59 | Admitting: Psychiatry

## 2012-08-28 DIAGNOSIS — F329 Major depressive disorder, single episode, unspecified: Secondary | ICD-10-CM

## 2012-08-28 DIAGNOSIS — F32A Depression, unspecified: Secondary | ICD-10-CM
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
## 2012-08-29 ENCOUNTER — Other Ambulatory Visit (HOSPITAL_COMMUNITY): Payer: 59 | Admitting: Psychiatry

## 2012-08-29 DIAGNOSIS — F32A Depression, unspecified: Secondary | ICD-10-CM

## 2012-08-29 DIAGNOSIS — F329 Major depressive disorder, single episode, unspecified: Secondary | ICD-10-CM

## 2012-08-29 MED ORDER — CHLORPROMAZINE HCL 10 MG PO TABS: 10.0000 mg | ORAL_TABLET | Freq: Every day | ORAL | Status: DC

## 2012-08-29 NOTE — Progress Notes (Signed)
    Daily Group Progress Note  Program: IOP  Group Time: 9:00-10:30 am   Participation Level: None  Behavioral Response: Resistant  Type of Therapy:  Process Group  Summary of Progress: Patient said she arrived to the group at 7:30 am today. She observed the group, but did not talk. She struggles to express her needs without being asked. This has become a goal of treatment to practice expressing feelings and assertive her needs.      Group Time: 10:30 am - 12:00 pm   Participation Level:  None  Behavioral Response: Resistant  Type of Therapy: Psycho-education Group  Summary of Progress: Patient learned the skill of Mindfulness and how to use it to be less emotionally reactive.   Carman Ching, LCSW
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
## 2012-08-30 ENCOUNTER — Other Ambulatory Visit (HOSPITAL_COMMUNITY): Payer: 59 | Admitting: Psychiatry

## 2012-08-30 DIAGNOSIS — F32A Depression, unspecified: Secondary | ICD-10-CM

## 2012-08-30 DIAGNOSIS — F329 Major depressive disorder, single episode, unspecified: Secondary | ICD-10-CM
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
## 2012-08-31 ENCOUNTER — Other Ambulatory Visit (HOSPITAL_COMMUNITY): Payer: 59 | Admitting: Psychiatry

## 2012-08-31 DIAGNOSIS — F329 Major depressive disorder, single episode, unspecified: Secondary | ICD-10-CM

## 2012-08-31 DIAGNOSIS — F32A Depression, unspecified: Secondary | ICD-10-CM
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
## 2012-09-03 ENCOUNTER — Other Ambulatory Visit (HOSPITAL_COMMUNITY): Payer: 59 | Admitting: Psychiatry

## 2012-09-03 DIAGNOSIS — F32A Depression, unspecified: Secondary | ICD-10-CM

## 2012-09-03 DIAGNOSIS — F329 Major depressive disorder, single episode, unspecified: Secondary | ICD-10-CM

## 2012-09-03 MED ORDER — CHLORPROMAZINE HCL 10 MG PO TABS: 20.0000 mg | ORAL_TABLET | Freq: Every day | ORAL | Status: DC

## 2012-09-03 NOTE — Patient Instructions (Signed)
Pt completed MH-IOP today.  Will follow up with Dr. Nolen Mu on 10-01-12 and Areta Haber, RNC on 09-04-12 @ 8am.  Encouraged support groups.

## 2012-09-03 NOTE — Progress Notes (Signed)
    Daily Group Progress Note  Program: IOP  Group Time: 9:00-10:30 am   Participation Level: Minimal  Behavioral Response: Appropriate  Type of Therapy:  Process Group  Summary of Progress: Today was patients final day in the group. She was quiet and only spoke when encouraged to share, but she participated in her goodbye ceremony and heard positive comments from the other group members wishing her well. She agreed to continue attending the aftercare group every Thursday's for additional support.      Group Time: 10:30 am - 12:00 pm   Participation Level:  Active  Behavioral Response: Appropriate  Type of Therapy: Psycho-education Group  Summary of Progress: patient participated in a group facilitated by Theda Belfast on Grief and loss and and identified current losses impacting wellness and ways to appropriately grieve them.   Carman Ching, LCSW
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
## 2012-09-04 ENCOUNTER — Other Ambulatory Visit (HOSPITAL_COMMUNITY): Payer: 59

## 2012-09-05 ENCOUNTER — Other Ambulatory Visit (HOSPITAL_COMMUNITY): Payer: 59

## 2012-09-06 ENCOUNTER — Other Ambulatory Visit (HOSPITAL_COMMUNITY): Payer: 59

## 2012-09-07 ENCOUNTER — Other Ambulatory Visit (HOSPITAL_COMMUNITY): Payer: 59

## 2012-09-10 ENCOUNTER — Other Ambulatory Visit (HOSPITAL_COMMUNITY): Payer: 59

## 2012-09-11 ENCOUNTER — Other Ambulatory Visit (HOSPITAL_COMMUNITY): Payer: 59

## 2012-09-12 ENCOUNTER — Other Ambulatory Visit (HOSPITAL_COMMUNITY): Payer: 59

## 2012-09-13 ENCOUNTER — Other Ambulatory Visit (HOSPITAL_COMMUNITY): Payer: 59

## 2013-01-01 ENCOUNTER — Ambulatory Visit (INDEPENDENT_AMBULATORY_CARE_PROVIDER_SITE_OTHER): Payer: 59 | Admitting: Internal Medicine

## 2013-01-01 ENCOUNTER — Encounter: Payer: Self-pay | Admitting: Internal Medicine

## 2013-01-01 VITALS — BP 120/66 | HR 83 | Temp 98.0°F | Ht 65.0 in | Wt 190.8 lb

## 2013-01-01 DIAGNOSIS — R0989 Other specified symptoms and signs involving the circulatory and respiratory systems: Secondary | ICD-10-CM

## 2013-01-01 DIAGNOSIS — R0600 Dyspnea, unspecified: Secondary | ICD-10-CM

## 2013-01-01 DIAGNOSIS — R0609 Other forms of dyspnea: Secondary | ICD-10-CM

## 2013-01-01 NOTE — Patient Instructions (Addendum)
Add pepcid 20 mg one at bedtime for at least a month to see if any of your am problems are better  Call if night time cough not improving call Libby at [phone] and she will schedule a sinus CT  To get the most out of exercise, you need to be continuously aware that you are short of breath, but never out of breath, for 30 minutes daily. As you improve, it will actually be easier for you to do the same amount of exercise  in  30 minutes so always push to the level where you are short of breath.   Please see patient coordinator before you leave today  to schedule CPST with spirometry before and after

## 2013-01-01 NOTE — Addendum Note (Signed)
Addended by: Darrell Jewel on: 01/01/2013 05:12 PM   Modules accepted: Orders

## 2013-01-01 NOTE — Assessment & Plan Note (Signed)
07/27/11 Walked RA x 3 laps @ 185 ft each stopped due to  End of study, no tachycardia or desat Spirometry p exercise wnl 07/28/2011  - 09/15/2011  Walked RA x 3 laps @ 185 ft each stopped due to  End of study, no desat - 01/01/2013  Walked RA x 3 laps @ 185 ft each stopped due to slt dizzy no desat or tachycardia  Symptoms are markedly disproportionate to objective findings and not clear this is a lung problem but pt does appear to have difficult airway management issues.  DDX of  difficult airways managment all start with A and  include Adherence, Ace Inhibitors, Acid Reflux, Active Sinus Disease, Alpha 1 Antitripsin deficiency, Anxiety masquerading as Airways dz,  ABPA,  allergy(esp in young), Aspiration (esp in elderly), Adverse effects of DPI,  Active smokers, plus two Bs  = Bronchiectasis and Beta blocker use..and one C= CHF  Anxiety is usually a dx of exclusion but near the top of the list here as the only medication that has helped is clonazepam > continue  ? Allergy/ asthma > no worse off symbicort > no better on neb > d/c  ? Acid reflux > assoc with hoarseness, worse cough at hs and in am > trial max acid suppression/ diet and then cpst  ? Active sinus dz > if cough continues then next step is sinus ct

## 2013-01-01 NOTE — Progress Notes (Signed)
Subjective:    Patient ID: NEIMA LACROSS, female    DOB: 12/12/1960    MRN: [id number]  HPI  34 yowf never smoker, never trouble with breathing as a child, new onset  Sob 04/2011  eval by [facility] Chest early September "on a Friday" and by Saturday assoc with with cp dx pe> admitted Kathryne Sharper some better by discharge then lost ground in terms of sob  and referred by Dr Collins Scotland 07/27/2011 for second opinion.  07/14/11 eval in [facility] ER with nl labs, no evidence PE on ct angiogram, very small R effusion  07/27/2011 1st pulmonary eval for sob not present sitting, not sleeping, doe x 100 yards but highly variable,  Assoc with hoarseness, not reproducible in office on day of ov, assoc with minimal dry cough and dysphagia despite ppi daily. No assoc focal leg swelling/ pain or dx of dvt pft's and echo at [facility] oK per pt proaire or neb > no better.  rec Continue omeprazole 40 mg dosed 30-60 min before eating Pepcid ac 20 mg one at bedtime GERD diet  09/15/2011 f/u ov/Latrel Szymczak cc no change in doe x 100 yards, still variable some better with clonazepam. No cough. rec Please see patient coordinator before you leave today  to schedule echocardiogram to be sure your heart has recovered from the blood clots Let Dr Nolen Mu know if your breathing gets worse with tapering your clonazepam If your echocardiogram is ok the next step in the workup is a formal exercise test at our [facility] street office - I will schedule this after I have a chance to look at your echo first.   01/01/2013 ov/ Butch Otterson re PE now on xarelto/ no longer on pepcid at hs Chief Complaint  Patient presents with  . Acute Visit    increased SOB at rest and with exertion over the past month, wheezing, chest tightness/pain, and cough with yellow mucus at times.   doe progressively worse x one year, nowconsistently reproducible with more than a slow walk, feels like worse activity is shower in am and dressing and feels like she's going to pass  out. Voice never improved on omeprazole and not on pepcid - neb albuterol doesn't help, also perceives hear racing.  Cardiac w/u with indwelling monitor ongoing but neg so far. Cough is hs and in am but minimally productive  Sleeping ok without nocturnal  or early am exacerbation  of respiratory  c/o's or need for noct saba. Also denies any obvious fluctuation of symptoms with weather or environmental changes or other aggravating or alleviating factors except as outlined above   No obvious daytime variabilty or asso cp or chest tightness, subjective wheeze overt sinus or hb symptoms. No unusual exp hx or h/o childhood pna/ asthma or premature birth to her knowledge.   Current Medications, Allergies, Past Medical History, Past Surgical History, Family History, and Social History were reviewed in [employer] record.  ROS  The following are not active complaints unless bolded sore throat, dysphagia, dental problems, itching, sneezing,  nasal congestion or excess/ purulent secretions, ear ache,   fever, chills, sweats, unintended wt loss, pleuritic or exertional cp, hemoptysis,  orthopnea pnd or leg swelling, presyncope, palpitations, heartburn, abdominal pain, anorexia, nausea, vomiting, diarrhea  or change in bowel or urinary habits, change in stools or urine, dysuria,hematuria,  rash, arthralgias, visual complaints, headache, numbness weakness or ataxia or problems with walking or coordination,  change in mood/affect or memory.  Objective:   Physical Exam amb anxious obese hoarse wf nad    Wt 205 07/27/11 >  09/15/11 205 > 190 01/01/2013   HEENT: nl dentition, turbinates, and orophanx. Nl external ear canals without cough reflex   NECK :  without JVD/Nodes/TM/ nl carotid upstrokes bilaterally   LUNGS: no acc muscle use, clear to A and P bilaterally without cough on insp or exp maneuvers   CV:  RRR  no s3 or murmur or increase in P2, no edema   ABD:   soft and nontender with nl excursion in the supine position. No bruits or organomegaly, bowel sounds nl  MS:  warm without deformities, calf tenderness, cyanosis or clubbing          Assessment & Plan:

## 2013-01-15 ENCOUNTER — Ambulatory Visit (HOSPITAL_COMMUNITY): Payer: 59 | Attending: Internal Medicine

## 2013-01-15 DIAGNOSIS — R0989 Other specified symptoms and signs involving the circulatory and respiratory systems: Secondary | ICD-10-CM | POA: Insufficient documentation

## 2013-01-15 DIAGNOSIS — R0609 Other forms of dyspnea: Secondary | ICD-10-CM | POA: Insufficient documentation

## 2013-01-15 DIAGNOSIS — R0600 Dyspnea, unspecified: Secondary | ICD-10-CM

## 2013-01-15 DIAGNOSIS — R0602 Shortness of breath: Secondary | ICD-10-CM

## 2013-01-29 ENCOUNTER — Telehealth: Payer: Self-pay | Admitting: Internal Medicine

## 2013-01-29 NOTE — Telephone Encounter (Signed)
Pt is requesting CPST results. Please  advise MW thanks  ATC pt to make her aware MW is not in but pt was not available wcb

## 2013-01-30 ENCOUNTER — Encounter: Payer: Self-pay | Admitting: Internal Medicine

## 2013-01-30 NOTE — Telephone Encounter (Signed)
Spoke with pt and notified of results/recs per MW She verbalized understanding OV with MW scheduled for 02/12/13 at 11:45 am

## 2013-01-30 NOTE — Telephone Encounter (Signed)
Let her know that I reviewed the study and it's all good news: there is not a physiologic limitation to ex by her lungs, heart or circulation but conditioning appears to be the limiting factor.   Needs ov when I get back to town to go over the study in detail and try to arrive at an effective plan to correct this through paced exercise unless she's making steady progress on her own.  Send copy of this note to primary.

## 2013-02-12 ENCOUNTER — Ambulatory Visit (INDEPENDENT_AMBULATORY_CARE_PROVIDER_SITE_OTHER)
Admission: RE | Admit: 2013-02-12 | Discharge: 2013-02-12 | Disposition: A | Discharge status: Home / Self Care | Payer: PRIVATE HEALTH INSURANCE | Source: Ambulatory Visit | Attending: Internal Medicine | Admitting: Internal Medicine

## 2013-02-12 ENCOUNTER — Encounter: Payer: Self-pay | Admitting: Internal Medicine

## 2013-02-12 ENCOUNTER — Ambulatory Visit (INDEPENDENT_AMBULATORY_CARE_PROVIDER_SITE_OTHER): Payer: PRIVATE HEALTH INSURANCE | Admitting: Internal Medicine

## 2013-02-12 VITALS — BP 104/62 | HR 85 | Temp 97.5°F | Ht 63.5 in | Wt 185.0 lb

## 2013-02-12 DIAGNOSIS — R490 Dysphonia: Secondary | ICD-10-CM | POA: Insufficient documentation

## 2013-02-12 DIAGNOSIS — R0600 Dyspnea, unspecified: Secondary | ICD-10-CM

## 2013-02-12 DIAGNOSIS — R0609 Other forms of dyspnea: Secondary | ICD-10-CM

## 2013-02-12 DIAGNOSIS — R0989 Other specified symptoms and signs involving the circulatory and respiratory systems: Secondary | ICD-10-CM

## 2013-02-12 NOTE — Progress Notes (Signed)
Subjective:    Patient ID: Allison [name], female    DOB: March 07, 1961    MRN: [id number]   Brief patient profile: 52 yowf never smoker, never trouble with breathing as a child, new onset  Sob 04/2011  eval by [facility] Chest early September "on a Friday" and by Saturday assoc with with cp dx pe> admitted Kathryne Sharper some better by discharge then lost ground in terms of sob  and referred by Dr Collins Scotland 07/27/2011 for second opinion.  07/14/11 eval in [facility] ER with nl labs, no evidence PE on ct angiogram, very small R effusion  07/27/2011 1st pulmonary eval for sob not present sitting, not sleeping, doe x 100 yards but highly variable,  Assoc with hoarseness, not reproducible in office on day of ov, assoc with minimal dry cough and dysphagia despite ppi daily. No assoc focal leg swelling/ pain or dx of dvt pft's and echo at [facility] oK per pt proaire or neb > no better.  rec Continue omeprazole 40 mg dosed 30-60 min before eating Pepcid ac 20 mg one at bedtime GERD diet  09/15/2011 f/u ov/Shyonna Carlin cc no change in doe x 100 yards, still variable some better with clonazepam. No cough. rec Please see patient coordinator before you leave today  to schedule echocardiogram to be sure your heart has recovered from the blood clots Let Dr Nolen Mu know if your breathing gets worse with tapering your clonazepam If your echocardiogram is ok the next step in the workup is a formal exercise test at our [facility] street office - I will schedule this after I have a chance to look at your echo first.   01/01/2013 ov/ Odilon Cass re PE now on xarelto/ no longer on pepcid at hs Chief Complaint  Patient presents with  . Acute Visit    increased SOB at rest and with exertion over the past month, wheezing, chest tightness/pain, and cough with yellow mucus at times.   doe progressively worse x one year, now consistently reproducible with more than a slow walk, feels like worse activity is shower in am and dressing and feels like  she's going to pass out. Voice never improved on omeprazole and not on pepcid - neb albuterol doesn't help, also perceives hear racing.  Cardiac w/u with indwelling monitor ongoing but neg so far. Cough is hs and in am but minimally productive Rec: Add pepcid 20 mg one at bedtime for at least a month to see if any of your am problems are better Call if night time cough not improving call Libby at [phone] and she will schedule a sinus CT  cpst neg 01/15/13  x leg fatigue     02/12/2013 f/u ov/Arrion Broaddus re sob/  Symptoms have changed considerably Chief Complaint  Patient presents with  . Follow-up    Pt states cough has improved, but her breathing is unchanged. No new co's today.  the problem with exercise has resolved walking 30 min never sob now The problem is having daily sob with voice assoc with hoarseness, worse as day goes on, cleaning supplies make it worse, not waking at hs at all  No obvious daytime variabilty or assoc chronic cough or cp or chest tightness, subjective wheeze overt sinus or hb symptoms. No unusual exp hx or h/o childhood pna/ asthma or knowledge of premature birth.     Sleeping ok without nocturnal  or early am exacerbation  of respiratory  c/o's or need for noct saba. Also denies any obvious fluctuation of symptoms  with weather or environmental changes or other aggravating or alleviating factors except as outlined above      Current Medications, Allergies, Past Medical History, Past Surgical History, Family History, and Social History were reviewed in [employer] record.  ROS  The following are not active complaints unless bolded sore throat, dysphagia, dental problems, itching, sneezing,  nasal congestion or excess/ purulent secretions, ear ache,   fever, chills, sweats, unintended wt loss, pleuritic or exertional cp, hemoptysis,  orthopnea pnd or leg swelling, presyncope, palpitations, heartburn, abdominal pain, anorexia, nausea, vomiting,  diarrhea  or change in bowel or urinary habits, change in stools or urine, dysuria,hematuria,  rash, arthralgias, visual complaints, headache, numbness weakness or ataxia or problems with walking or coordination,  change in mood/affect or memory.                 Objective:   Physical Exam  amb anxious obese very hoarse wf nad    Wt 205 07/27/11 >  09/15/11 205 > 190 01/01/2013 > 185 02/12/2013   HEENT: nl dentition, turbinates, and orophanx. Nl external ear canals without cough reflex   NECK :  without JVD/Nodes/TM/ nl carotid upstrokes bilaterally   LUNGS: no acc muscle use, clear to A and P bilaterally without cough on insp or exp maneuvers   CV:  RRR  no s3 or murmur or increase in P2, no edema   ABD:  soft and nontender with nl excursion in the supine position. No bruits or organomegaly, bowel sounds nl  MS:  warm without deformities, calf tenderness, cyanosis or clubbing   CXR  02/12/2013 :   COPD. No acute abnormality.           Assessment & Plan:

## 2013-02-12 NOTE — Assessment & Plan Note (Addendum)
07/27/11 Walked RA x 3 laps @ 185 ft each stopped due to  End of study, no tachycardia or desat Spirometry p exercise wnl 07/28/2011  - 09/15/2011  Walked RA x 3 laps @ 185 ft each stopped due to  End of study, no desat - 01/01/2013  Walked RA x 3 laps @ 185 ft each stopped due to slt dizzy no desat or tachycardia - CPST 01/15/13 > no ventilatory or circulatory limits, stopped due to leg fatigue  I had an extended summary discussion with the patient today lasting 15 to 20 minutes of a 25 minute visit on the following issues:   This problem has been corrected with regular exercise c/w deconditiong > rec continue daily ex but not out of breath. Pulmonary f/u prn

## 2013-02-12 NOTE — Patient Instructions (Addendum)
Please remember to go to the x-ray department downstairs for your tests - we will call you with the results when they are available.  Please see patient coordinator before you leave today  to schedule ENT eval for your hoarsness  Pulmonary follow up is as needed

## 2013-02-12 NOTE — Assessment & Plan Note (Signed)
Variably present ever since PE but note not intubated and not better with GERD rx so needs  ENT eval to sort out and refer to GI if related to GERD (non acid GERD also a concern in pt with wt gain).

## 2013-04-24 NOTE — Progress Notes (Signed)
This encounter was created in error - please disregard.

## 2013-08-15 IMAGING — CT CT PELVIS W/O CM
3 of 6 series · 12 of 46 positions shown, 19 images · non-contrast
Comparison: Plain films 03/16/2011

CLINICAL DATA: Sacral/coccyx pain.  No history of trauma.

CT PELVIS WITHOUT CONTRAST
TECHNIQUE: Multidetector CT imaging of the pelvis was performed
following the standard protocol without intravenous contrast.

[Series 4: pelvis soft · axial · 0.62mm/px · z∈[-224,-44]mm · 8 of 93 slices shown, 13 images]
[im 11/93  soft-tissue]
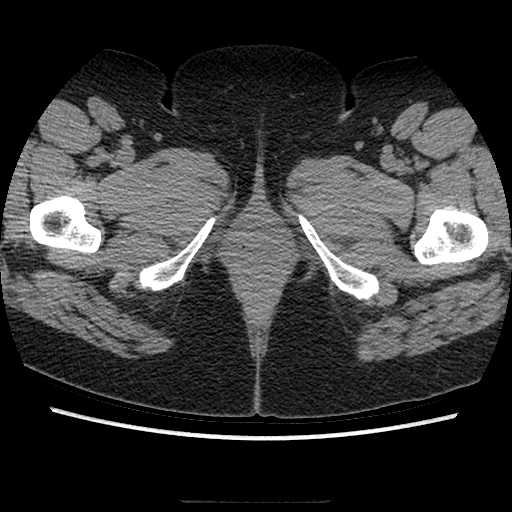
[im 11/93  bone]
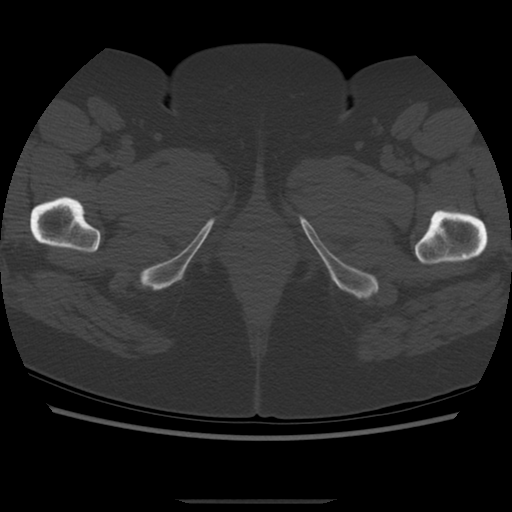
[im 21/93  soft-tissue]
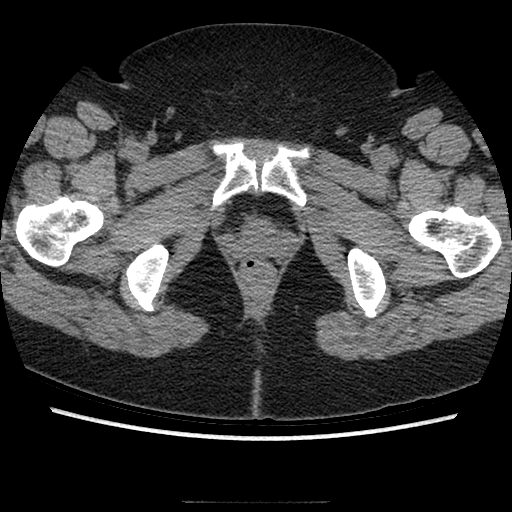
[im 31/93  soft-tissue]
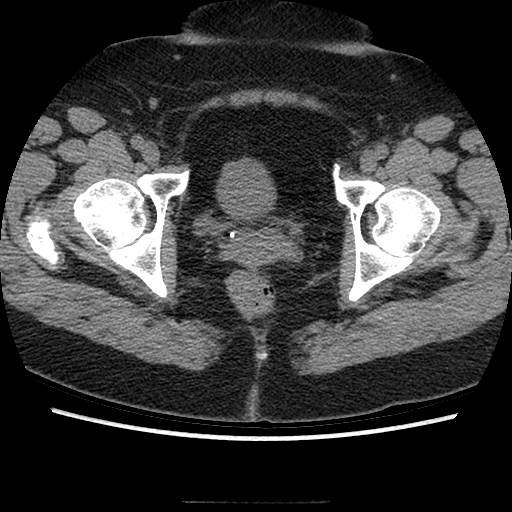
[im 41/93  soft-tissue]
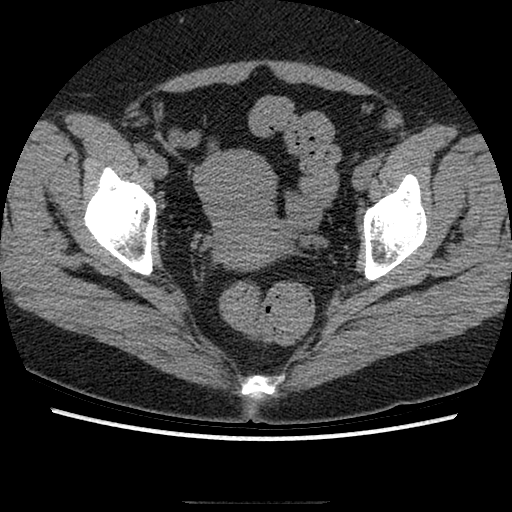
[im 52/93  soft-tissue]
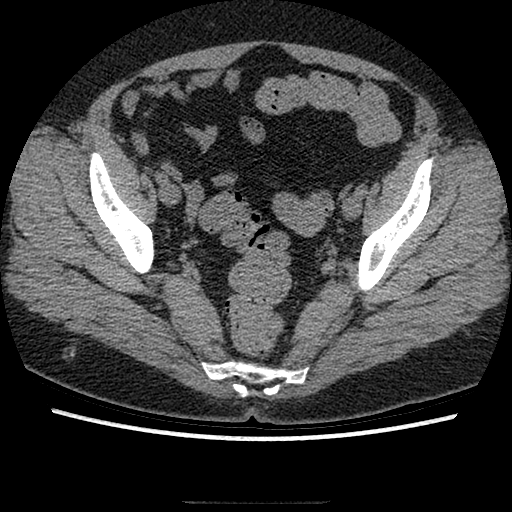
[im 52/93  lung]
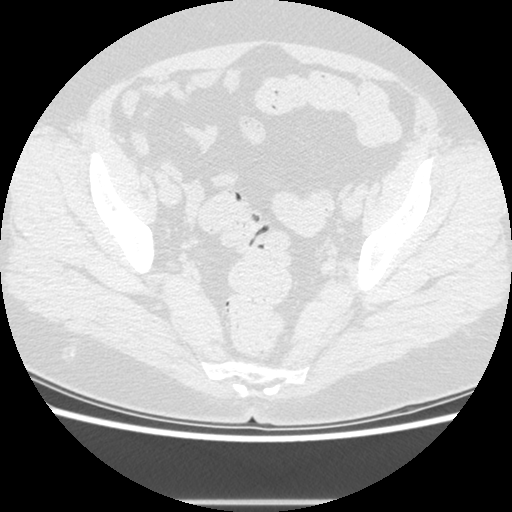
[im 62/93  soft-tissue]
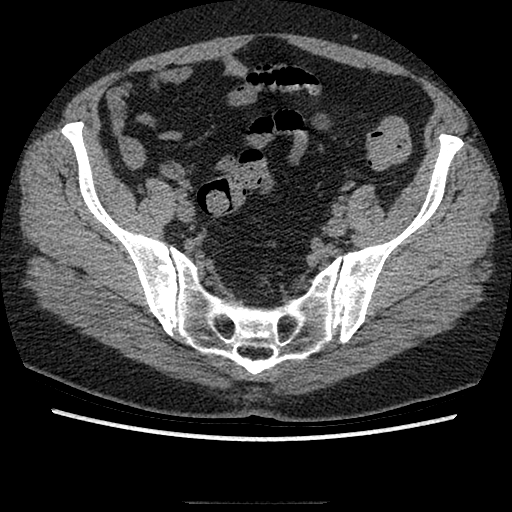
[im 62/93  lung]
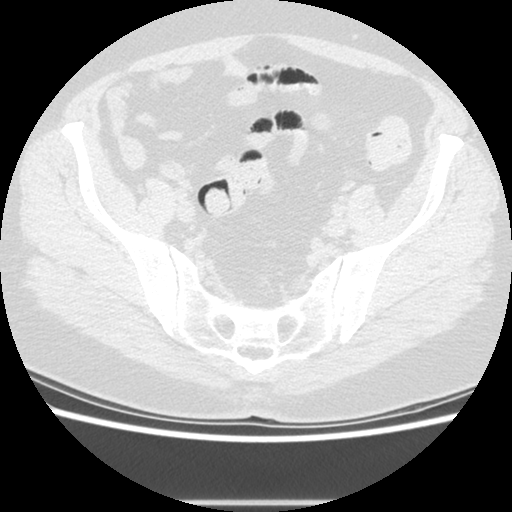
[im 72/93  soft-tissue]
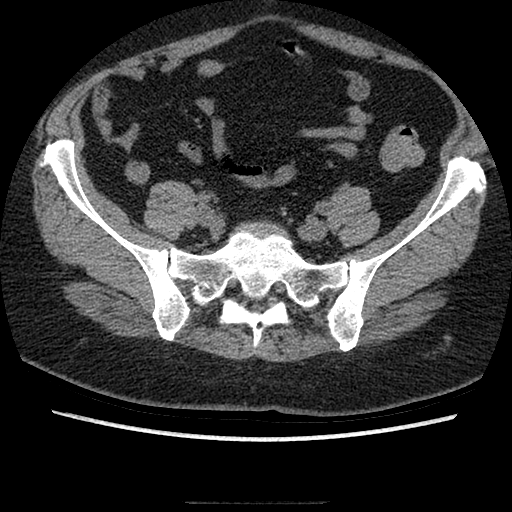
[im 72/93  lung]
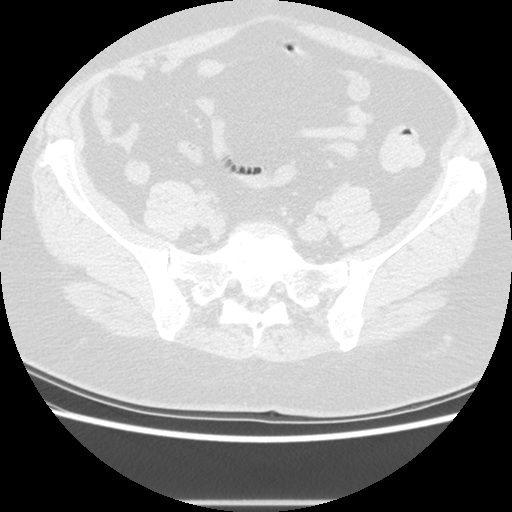
[im 82/93  soft-tissue]
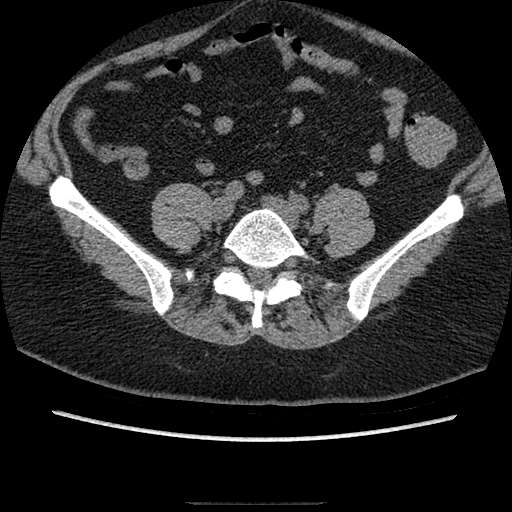
[im 82/93  lung]
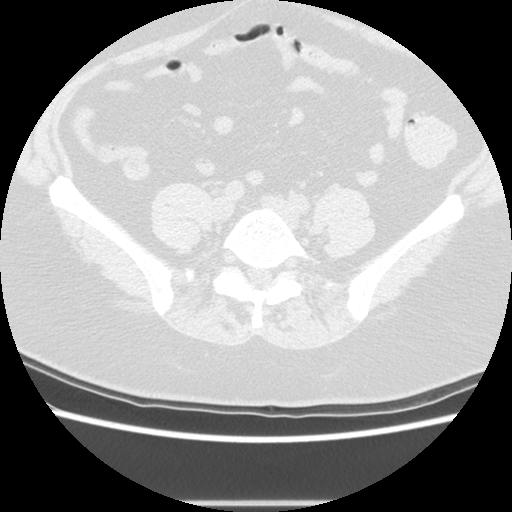

[Series 102: cor coccyx · coronal · 0.35mm/px · 3 of 24 slices shown, 4 images]
[im 8/24  soft-tissue]
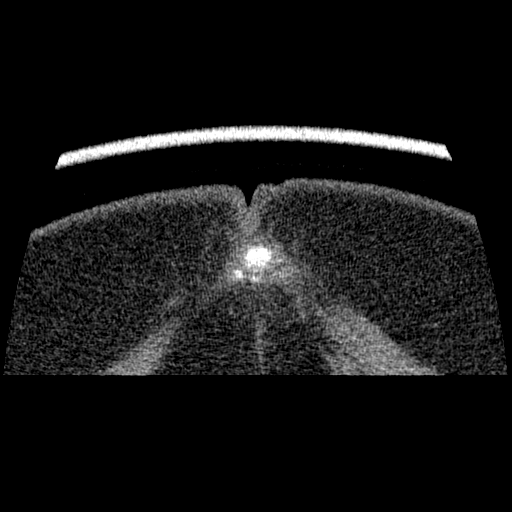
[im 11/24  soft-tissue]
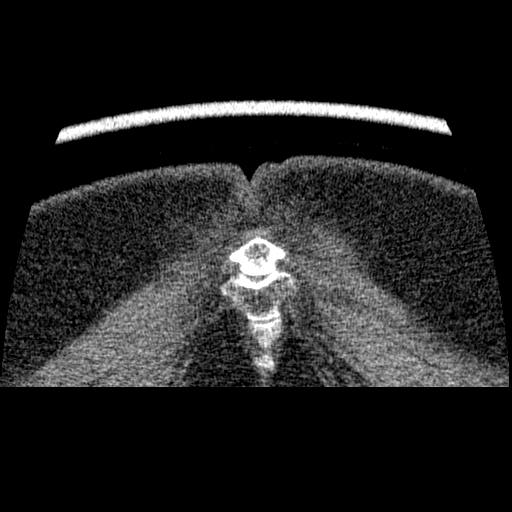
[im 11/24  bone]
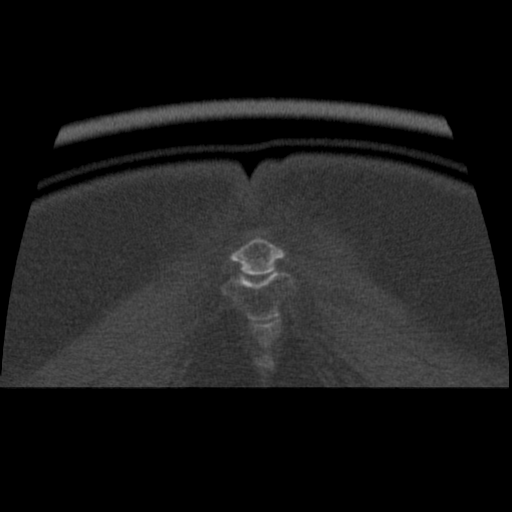
[im 13/24  soft-tissue]
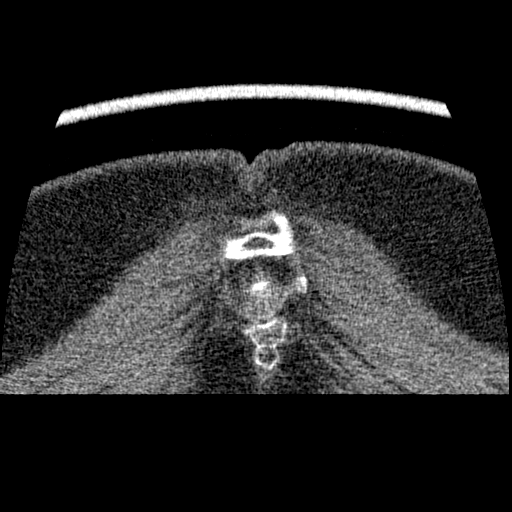

[Series 601: coronal body · coronal · 0.62mm/px · 1 of 109 slices shown, 2 images]
[im 55/109  soft-tissue]
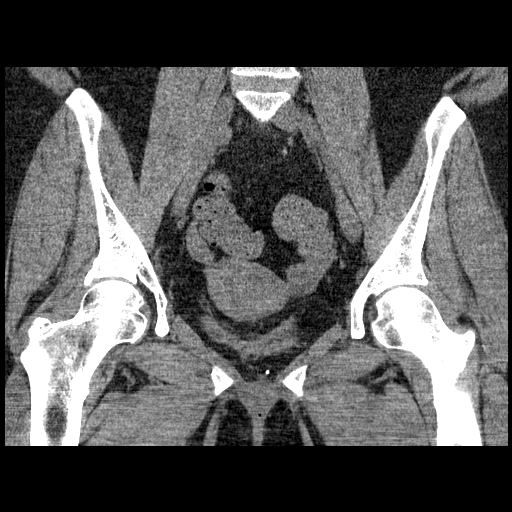
[im 55/109  bone]
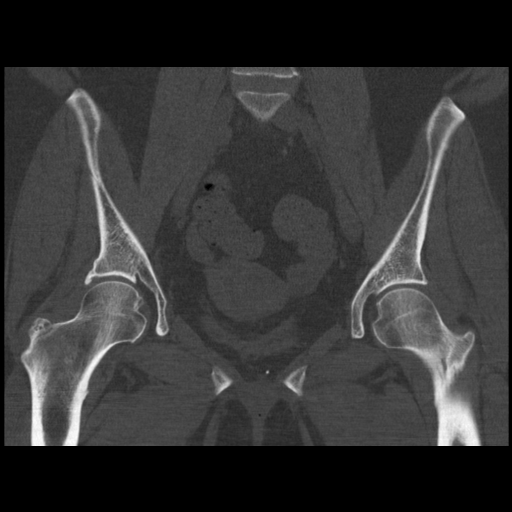

[12 of 46 positions shown; findings below may reference images not displayed]

FINDINGS: No acute bony abnormality.  No sacral or coccygeal
fracture or other abnormality.  SI joints are symmetric and
unremarkable.  Within the left iliac bone lateral to the SI joint,
there is a small lesion which measures approximately 12 mm.  This
has well-defined sclerotic margins and central lucency.  This has a
benign nonaggressive appearance, possibly representing a small
enchondroma or other benign process.

Visualized large and small bowel are unremarkable.  Uterus and
adnexa have a grossly unremarkable unenhanced appearance.  Bladder
is decompressed.  No free fluid, free air or adenopathy.
IMPRESSION: No acute bony abnormality.

## 2013-10-30 ENCOUNTER — Ambulatory Visit (HOSPITAL_COMMUNITY)
Admission: RE | Admit: 2013-10-30 | Discharge: 2013-10-30 | Disposition: A | Discharge status: Home / Self Care | Payer: PRIVATE HEALTH INSURANCE | Source: Ambulatory Visit | Attending: Family Medicine | Admitting: Family Medicine

## 2013-10-30 ENCOUNTER — Encounter (HOSPITAL_COMMUNITY): Payer: Self-pay

## 2013-10-30 ENCOUNTER — Other Ambulatory Visit (HOSPITAL_COMMUNITY): Payer: Self-pay | Admitting: Family Medicine

## 2013-10-30 DIAGNOSIS — R079 Chest pain, unspecified: Secondary | ICD-10-CM

## 2013-10-30 DIAGNOSIS — Z86711 Personal history of pulmonary embolism: Secondary | ICD-10-CM | POA: Insufficient documentation

## 2013-10-30 MED ORDER — IOHEXOL 350 MG/ML SOLN
100.0000 mL | Freq: Once | INTRAVENOUS | Status: AC | PRN
  Administered 2013-10-30: 100 mL via INTRAVENOUS

## 2013-10-30 MED ORDER — IOHEXOL 300 MG/ML  SOLN: 100.0000 mL | Freq: Once | INTRAMUSCULAR | Status: DC | PRN

## 2016-09-27 ENCOUNTER — Ambulatory Visit (INDEPENDENT_AMBULATORY_CARE_PROVIDER_SITE_OTHER): Payer: BLUE CROSS/BLUE SHIELD | Admitting: Psychology

## 2016-09-27 DIAGNOSIS — F331 Major depressive disorder, recurrent, moderate: Secondary | ICD-10-CM

## 2016-10-20 ENCOUNTER — Ambulatory Visit (INDEPENDENT_AMBULATORY_CARE_PROVIDER_SITE_OTHER): Payer: BLUE CROSS/BLUE SHIELD | Admitting: Psychology

## 2016-10-20 DIAGNOSIS — F331 Major depressive disorder, recurrent, moderate: Secondary | ICD-10-CM

## 2016-11-09 ENCOUNTER — Ambulatory Visit (INDEPENDENT_AMBULATORY_CARE_PROVIDER_SITE_OTHER): Payer: BLUE CROSS/BLUE SHIELD | Admitting: Psychology

## 2016-11-09 DIAGNOSIS — F331 Major depressive disorder, recurrent, moderate: Secondary | ICD-10-CM | POA: Diagnosis not present

## 2016-12-02 ENCOUNTER — Ambulatory Visit (INDEPENDENT_AMBULATORY_CARE_PROVIDER_SITE_OTHER): Payer: BLUE CROSS/BLUE SHIELD | Admitting: Psychology

## 2016-12-02 DIAGNOSIS — F331 Major depressive disorder, recurrent, moderate: Secondary | ICD-10-CM

## 2016-12-21 ENCOUNTER — Ambulatory Visit (INDEPENDENT_AMBULATORY_CARE_PROVIDER_SITE_OTHER): Payer: BLUE CROSS/BLUE SHIELD | Admitting: Psychology

## 2016-12-21 DIAGNOSIS — F331 Major depressive disorder, recurrent, moderate: Secondary | ICD-10-CM | POA: Diagnosis not present

## 2017-01-13 ENCOUNTER — Ambulatory Visit (INDEPENDENT_AMBULATORY_CARE_PROVIDER_SITE_OTHER): Payer: BLUE CROSS/BLUE SHIELD | Admitting: Psychology

## 2017-01-13 DIAGNOSIS — F331 Major depressive disorder, recurrent, moderate: Secondary | ICD-10-CM

## 2017-01-31 ENCOUNTER — Ambulatory Visit (INDEPENDENT_AMBULATORY_CARE_PROVIDER_SITE_OTHER): Payer: BLUE CROSS/BLUE SHIELD | Admitting: Psychology

## 2017-01-31 DIAGNOSIS — F331 Major depressive disorder, recurrent, moderate: Secondary | ICD-10-CM

## 2017-02-20 ENCOUNTER — Ambulatory Visit (INDEPENDENT_AMBULATORY_CARE_PROVIDER_SITE_OTHER): Payer: BLUE CROSS/BLUE SHIELD | Admitting: Psychology

## 2017-02-20 DIAGNOSIS — F331 Major depressive disorder, recurrent, moderate: Secondary | ICD-10-CM | POA: Diagnosis not present

## 2017-03-21 ENCOUNTER — Ambulatory Visit (INDEPENDENT_AMBULATORY_CARE_PROVIDER_SITE_OTHER): Payer: BLUE CROSS/BLUE SHIELD | Admitting: Psychology

## 2017-03-21 DIAGNOSIS — F331 Major depressive disorder, recurrent, moderate: Secondary | ICD-10-CM

## 2017-04-04 ENCOUNTER — Ambulatory Visit (INDEPENDENT_AMBULATORY_CARE_PROVIDER_SITE_OTHER): Payer: BLUE CROSS/BLUE SHIELD | Admitting: Psychology

## 2017-04-04 DIAGNOSIS — F331 Major depressive disorder, recurrent, moderate: Secondary | ICD-10-CM | POA: Diagnosis not present

## 2017-04-19 ENCOUNTER — Ambulatory Visit: Payer: BLUE CROSS/BLUE SHIELD | Admitting: Psychology

## 2017-05-03 ENCOUNTER — Ambulatory Visit: Payer: Self-pay | Admitting: Psychology

## 2017-05-04 ENCOUNTER — Ambulatory Visit (INDEPENDENT_AMBULATORY_CARE_PROVIDER_SITE_OTHER): Payer: BLUE CROSS/BLUE SHIELD | Admitting: Psychology

## 2017-05-04 DIAGNOSIS — F331 Major depressive disorder, recurrent, moderate: Secondary | ICD-10-CM | POA: Diagnosis not present

## 2017-05-25 ENCOUNTER — Ambulatory Visit (INDEPENDENT_AMBULATORY_CARE_PROVIDER_SITE_OTHER): Payer: BLUE CROSS/BLUE SHIELD | Admitting: Psychology

## 2017-05-25 DIAGNOSIS — F331 Major depressive disorder, recurrent, moderate: Secondary | ICD-10-CM | POA: Diagnosis not present

## 2017-06-07 ENCOUNTER — Ambulatory Visit: Payer: BLUE CROSS/BLUE SHIELD | Admitting: Psychology

## 2017-07-04 ENCOUNTER — Ambulatory Visit (INDEPENDENT_AMBULATORY_CARE_PROVIDER_SITE_OTHER): Payer: BLUE CROSS/BLUE SHIELD | Admitting: Psychology

## 2017-07-04 DIAGNOSIS — F331 Major depressive disorder, recurrent, moderate: Secondary | ICD-10-CM | POA: Diagnosis not present

## 2017-07-25 ENCOUNTER — Ambulatory Visit (INDEPENDENT_AMBULATORY_CARE_PROVIDER_SITE_OTHER): Payer: BLUE CROSS/BLUE SHIELD | Admitting: Psychology

## 2017-07-25 DIAGNOSIS — F331 Major depressive disorder, recurrent, moderate: Secondary | ICD-10-CM | POA: Diagnosis not present

## 2017-08-11 ENCOUNTER — Ambulatory Visit: Payer: BLUE CROSS/BLUE SHIELD | Admitting: Psychology

## 2017-08-25 ENCOUNTER — Ambulatory Visit: Payer: BLUE CROSS/BLUE SHIELD | Admitting: Psychology

## 2017-08-25 DIAGNOSIS — F331 Major depressive disorder, recurrent, moderate: Secondary | ICD-10-CM

## 2017-09-04 ENCOUNTER — Ambulatory Visit: Payer: BLUE CROSS/BLUE SHIELD | Admitting: Psychology

## 2017-09-18 ENCOUNTER — Ambulatory Visit (INDEPENDENT_AMBULATORY_CARE_PROVIDER_SITE_OTHER): Payer: BLUE CROSS/BLUE SHIELD | Admitting: Psychology

## 2017-09-18 DIAGNOSIS — F331 Major depressive disorder, recurrent, moderate: Secondary | ICD-10-CM

## 2017-10-05 ENCOUNTER — Ambulatory Visit (INDEPENDENT_AMBULATORY_CARE_PROVIDER_SITE_OTHER): Payer: BLUE CROSS/BLUE SHIELD | Admitting: Psychology

## 2017-10-05 DIAGNOSIS — F331 Major depressive disorder, recurrent, moderate: Secondary | ICD-10-CM

## 2017-10-24 ENCOUNTER — Ambulatory Visit (INDEPENDENT_AMBULATORY_CARE_PROVIDER_SITE_OTHER): Payer: BLUE CROSS/BLUE SHIELD | Admitting: Psychology

## 2017-10-24 DIAGNOSIS — F331 Major depressive disorder, recurrent, moderate: Secondary | ICD-10-CM | POA: Diagnosis not present

## 2017-11-16 ENCOUNTER — Ambulatory Visit (INDEPENDENT_AMBULATORY_CARE_PROVIDER_SITE_OTHER): Payer: BLUE CROSS/BLUE SHIELD | Admitting: Psychology

## 2017-11-16 DIAGNOSIS — F331 Major depressive disorder, recurrent, moderate: Secondary | ICD-10-CM | POA: Diagnosis not present

## 2017-11-30 ENCOUNTER — Telehealth (HOSPITAL_COMMUNITY): Payer: Self-pay

## 2017-11-30 ENCOUNTER — Encounter

## 2017-11-30 ENCOUNTER — Encounter (HOSPITAL_COMMUNITY): Payer: Self-pay | Admitting: Psychiatry

## 2017-11-30 ENCOUNTER — Ambulatory Visit (HOSPITAL_COMMUNITY): Payer: BLUE CROSS/BLUE SHIELD | Admitting: Psychiatry

## 2017-11-30 VITALS — BP 128/76 | HR 96 | Ht 64.0 in | Wt 200.0 lb

## 2017-11-30 DIAGNOSIS — Z818 Family history of other mental and behavioral disorders: Secondary | ICD-10-CM | POA: Diagnosis not present

## 2017-11-30 DIAGNOSIS — M549 Dorsalgia, unspecified: Secondary | ICD-10-CM | POA: Diagnosis not present

## 2017-11-30 DIAGNOSIS — F4312 Post-traumatic stress disorder, chronic: Secondary | ICD-10-CM

## 2017-11-30 DIAGNOSIS — G8929 Other chronic pain: Secondary | ICD-10-CM

## 2017-11-30 DIAGNOSIS — Z62810 Personal history of physical and sexual abuse in childhood: Secondary | ICD-10-CM | POA: Diagnosis not present

## 2017-11-30 DIAGNOSIS — G43909 Migraine, unspecified, not intractable, without status migrainosus: Secondary | ICD-10-CM | POA: Diagnosis not present

## 2017-11-30 DIAGNOSIS — F3341 Major depressive disorder, recurrent, in partial remission: Secondary | ICD-10-CM

## 2017-11-30 DIAGNOSIS — Z915 Personal history of self-harm: Secondary | ICD-10-CM | POA: Diagnosis not present

## 2017-11-30 DIAGNOSIS — Z79899 Other long term (current) drug therapy: Secondary | ICD-10-CM | POA: Diagnosis not present

## 2017-11-30 MED ORDER — TOPIRAMATE 100 MG PO TABS: 100.0000 mg | ORAL_TABLET | Freq: Every day | ORAL | 1 refills | Status: DC

## 2017-11-30 MED ORDER — TOPIRAMATE 100 MG PO TABS: 50.0000 mg | ORAL_TABLET | Freq: Every day | ORAL | 1 refills | Status: DC

## 2017-11-30 MED ORDER — TRAZODONE HCL ER 300 MG PO TB24: 1.0000 | ORAL_TABLET | Freq: Every day | ORAL | 1 refills | Status: AC

## 2017-11-30 MED ORDER — CLONAZEPAM 0.5 MG PO TABS: 0.5000 mg | ORAL_TABLET | Freq: Two times a day (BID) | ORAL | 2 refills | Status: DC

## 2017-11-30 NOTE — Telephone Encounter (Signed)
Oh yes that is fine

## 2017-11-30 NOTE — Telephone Encounter (Signed)
Called pharmacy back and spoke to the pharmacist Debbie, I let her know to fill regular Trazodone.
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
## 2017-11-30 NOTE — Telephone Encounter (Signed)
Patients pharmacy called, they said they can not get the Trazodone HCI and want to know if they can fill regular Trazodone tabs. Please review and advise, thank you

## 2017-12-05 ENCOUNTER — Ambulatory Visit: Payer: BLUE CROSS/BLUE SHIELD | Admitting: Psychology

## 2017-12-05 DIAGNOSIS — F331 Major depressive disorder, recurrent, moderate: Secondary | ICD-10-CM

## 2017-12-18 ENCOUNTER — Ambulatory Visit: Payer: BLUE CROSS/BLUE SHIELD | Admitting: Psychology

## 2018-01-05 ENCOUNTER — Ambulatory Visit (INDEPENDENT_AMBULATORY_CARE_PROVIDER_SITE_OTHER): Payer: BLUE CROSS/BLUE SHIELD | Admitting: Psychology

## 2018-01-05 DIAGNOSIS — F331 Major depressive disorder, recurrent, moderate: Secondary | ICD-10-CM | POA: Diagnosis not present

## 2018-01-22 ENCOUNTER — Ambulatory Visit: Payer: BLUE CROSS/BLUE SHIELD | Admitting: Psychology

## 2018-01-23 ENCOUNTER — Ambulatory Visit: Payer: BLUE CROSS/BLUE SHIELD | Admitting: Psychology

## 2018-01-23 DIAGNOSIS — F331 Major depressive disorder, recurrent, moderate: Secondary | ICD-10-CM | POA: Diagnosis not present

## 2018-02-01 ENCOUNTER — Encounter (HOSPITAL_COMMUNITY): Payer: Self-pay | Admitting: Psychiatry

## 2018-02-01 ENCOUNTER — Ambulatory Visit (HOSPITAL_COMMUNITY): Payer: BLUE CROSS/BLUE SHIELD | Admitting: Psychiatry

## 2018-02-01 VITALS — BP 118/69 | HR 82 | Ht 64.0 in | Wt 181.0 lb

## 2018-02-01 DIAGNOSIS — F3341 Major depressive disorder, recurrent, in partial remission: Secondary | ICD-10-CM

## 2018-02-01 DIAGNOSIS — F4312 Post-traumatic stress disorder, chronic: Secondary | ICD-10-CM

## 2018-02-01 MED ORDER — CLONAZEPAM 0.5 MG PO TABS: 0.5000 mg | ORAL_TABLET | Freq: Two times a day (BID) | ORAL | 2 refills | Status: AC

## 2018-02-01 NOTE — Progress Notes (Signed)
BH MD/PA/NP OP Progress Note  02/01/2018 8:40 AM VEOLA CAFARO  MRN:  [id number]  Chief Complaint: doing okay, even mood  HPI: CARLISHA WISLER reports that her mood and anxiety are even, she reports that she and her husband are trying to work through some financial stressors and the cost of their healthcare needs.  I spent time with her expressing my grief and sadness for the financial stressors that they are experiencing, and validated that the healthcare system is quite expensive.  She was able to think through some ways to problem solve this with her husband.  She reports that her mood and anxiety have been steady, and overall she feels like she is doing pretty well.  She does feel like the Topamax has helped with her sleep and she is continuing to lose weight gradually.  She takes clonazepam and trazodone as scheduled.  Denies any acute safety concerns or suicidality.  She has had some constipation and a discussed multiple options with her, educated her on some over-the-counter remedies.  And educated her about bowel obstruction symptoms and reasons to visit the emergency or urgent care.  She has not had any nausea, fever, chills, severe abdominal pain, or confusion.  Visit Diagnosis:    ICD-10-CM   1. Chronic post-traumatic stress disorder (PTSD) F43.12   2. Recurrent major depressive disorder, in partial remission (HCC) F33.41     Past Psychiatric History: See intake H&P for full details. Reviewed, with no updates at this time.   Past Medical History:  Past Medical History:  Diagnosis Date  . Anxiety   . Asthma   . Chronic headaches   . Depression   . Diabetes mellitus, type II (HCC)   . Lupus anticoagulant syndrome (HCC)   . 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 PTSD (post-traumatic stress disorder)   . Pulmonary embolism [facility]) Sept 2012    Past Surgical History:  Procedure Laterality Date  . insertable cardiac monitor  5/12/14a   at [facility]  . KNEE  ARTHROSCOPY    . TONSILLECTOMY AND ADENOIDECTOMY      Family Psychiatric History: See intake H&P for full details. Reviewed, with no updates at this time.   Family History:  Family History  Problem Relation Age of Onset  . Asthma Mother   . Heart disease Mother   . Heart disease Father   . Depression Sister     Social History:  Social History   Socioeconomic History  . Marital status: Married    Spouse name: Not on file  . Number of children: 2  . Years of education: Not on file  . Highest education level: Not on file  Occupational History    Employer: [employer]  Social Needs  . Financial resource strain: Not on file  . Food insecurity:    Worry: Not on file    Inability: Not on file  . Transportation needs:    Medical: Not on file    Non-medical: Not on file  Tobacco Use  . Smoking status: Never Smoker  . Smokeless tobacco: Never Used  Substance and Sexual Activity  . Alcohol use: No  . Drug use: No  . Sexual activity: Not Currently  Lifestyle  . Physical activity:    Days per week: Not on file    Minutes per session: Not on file  . Stress: Not on file  Relationships  . Social connections:    Talks on phone: Not on file  Gets together: Not on file    Attends religious service: Not on file    Active member of club or organization: Not on file    Attends meetings of clubs or organizations: Not on file    Relationship status: Not on file  Other Topics Concern  . Not on file  Social History Narrative  . Not on file    Allergies:  Allergies  Allergen Reactions  . Geodon [Ziprasidone Hydrochloride]     "makes heart race"  . Prednisone     Dislikes how she feels on it   . Remeron [Mirtazapine]     Tachycardia, hypotension  . Vicodin [Hydrocodone-Acetaminophen] Nausea And Vomiting    Metabolic Disorder Labs: No results found for: HGBA1C, MPG No results found for: PROLACTIN No results found for: CHOL, TRIG, HDL, CHOLHDL, VLDL,  LDLCALC No results found for: TSH  Therapeutic Level Labs: No results found for: LITHIUM No results found for: VALPROATE No components found for:  CBMZ  Current Medications: Current Outpatient Medications  Medication Sig Dispense Refill  . albuterol (PROVENTIL HFA;VENTOLIN HFA) 108 (90 Base) MCG/ACT inhaler Inhale into the lungs.    Marland Kitchen atorvastatin (LIPITOR) 20 MG tablet Take 20 mg by mouth daily.    . clonazePAM (KLONOPIN) 0.5 MG tablet Take 1 tablet (0.5 mg total) by mouth 2 (two) times daily. For sleep. 60 tablet 2  . Cyanocobalamin (VITAMIN B-12 IJ) Inject as directed every 30 (thirty) days.    Marland Kitchen gabapentin (NEURONTIN) 600 MG tablet Take by mouth. Take 1 tablet in the am and 1/2 tablet at bedtime    . lubiprostone (AMITIZA) 24 MCG capsule Take 24 mcg by mouth 2 (two) times daily with a meal.    . metFORMIN (GLUCOPHAGE-XR) 500 MG 24 hr tablet TAKE 1 TABLET (500 MG TOTAL) BY MOUTH 2 TIMES DAILY BEFORE MEALS.  1  . montelukast (SINGULAIR) 10 MG tablet Take 10 mg by mouth daily.  5  . omeprazole (PRILOSEC) 40 MG capsule Take 40 mg by mouth every morning.     . Rivaroxaban (XARELTO) 20 MG TABS Take 20 mg by mouth daily.    . solifenacin (VESICARE) 10 MG tablet Take 10 mg by mouth daily.      Marland Kitchen topiramate (TOPAMAX) 100 MG tablet Take 1 tablet (100 mg total) by mouth at bedtime. 90 tablet 1  . TraZODone HCl (OLEPTRO) 300 MG TB24 Take 1 tablet (300 mg total) by mouth daily. 90 tablet 1   No current facility-administered medications for this visit.      Musculoskeletal: Strength & Muscle Tone: within normal limits Gait & Station: normal Patient leans: N/A  Psychiatric Specialty Exam: ROS  Blood pressure 118/69, pulse 82, height 5\' 4"  (1.626 m), weight 181 lb (82.1 kg).Body mass index is 31.07 kg/m.  General Appearance: Casual and Well Groomed  Eye Contact:  Good  Speech:  Clear and Coherent and Normal Rate  Volume:  Normal  Mood:  Euthymic  Affect:  Appropriate and Congruent   Thought Process:  Goal Directed and Descriptions of Associations: Intact  Orientation:  Full (Time, Place, and Person)  Thought Content: Logical   Suicidal Thoughts:  No  Homicidal Thoughts:  No  Memory:  Immediate;   Good  Judgement:  Good  Insight:  Good  Psychomotor Activity:  Normal  Concentration:  Concentration: Fair  Recall:  Fair  Fund of Knowledge: Fair  Language: Good  Akathisia:  Negative  Handed:  Right  AIMS (if indicated): not done  Assets:  Communication Skills Desire for Improvement Housing Intimacy Social Support Transportation  ADL's:  Intact  Cognition: WNL  Sleep:  Good   Screenings:   Assessment and Plan: Gray BernhardtDeborah H Ibe presents with stable mood and anxiety symptoms, contending with some appropriate stressors related to finances.  She has tolerated the increase in Topamax without any issues and feels like she is sleeping well and losing weight gradually.  She has elected to follow-up with this Clinical research associatewriter at private practice location in 2 months.  No acute safety issues, we will continue the medication regimen as below.  1. Chronic post-traumatic stress disorder (PTSD)   2. Recurrent major depressive disorder, in partial remission (HCC)     Status of current problems: stable  Labs Ordered: No orders of the defined types were placed in this encounter.   Labs Reviewed: n/a  Collateral Obtained/Records Reviewed: n/a  Plan:  Continue trazodone 300 mg nightly for depression and sleep Continue Topamax 100 mg nightly for mood and weight loss augmentation Continue clonazepam 0.5 mg twice a day rtc 2-3 months  Burnard LeighAlexander Arya Renardo Cheatum, MD 02/01/2018, 8:40 AM

## 2018-02-09 ENCOUNTER — Ambulatory Visit: Payer: BLUE CROSS/BLUE SHIELD | Admitting: Psychology

## 2018-02-09 DIAGNOSIS — F331 Major depressive disorder, recurrent, moderate: Secondary | ICD-10-CM

## 2018-02-26 ENCOUNTER — Ambulatory Visit: Payer: BLUE CROSS/BLUE SHIELD | Admitting: Psychology

## 2018-02-26 DIAGNOSIS — F331 Major depressive disorder, recurrent, moderate: Secondary | ICD-10-CM

## 2018-03-14 ENCOUNTER — Ambulatory Visit: Payer: BLUE CROSS/BLUE SHIELD | Admitting: Psychology

## 2018-03-14 DIAGNOSIS — F331 Major depressive disorder, recurrent, moderate: Secondary | ICD-10-CM

## 2018-03-27 ENCOUNTER — Ambulatory Visit: Payer: BLUE CROSS/BLUE SHIELD | Admitting: Psychology

## 2018-03-27 DIAGNOSIS — F331 Major depressive disorder, recurrent, moderate: Secondary | ICD-10-CM

## 2018-04-06 ENCOUNTER — Ambulatory Visit: Payer: BLUE CROSS/BLUE SHIELD | Admitting: Psychology

## 2018-04-06 DIAGNOSIS — F331 Major depressive disorder, recurrent, moderate: Secondary | ICD-10-CM

## 2018-04-13 ENCOUNTER — Ambulatory Visit: Payer: BLUE CROSS/BLUE SHIELD | Admitting: Psychology

## 2018-04-13 DIAGNOSIS — F331 Major depressive disorder, recurrent, moderate: Secondary | ICD-10-CM | POA: Diagnosis not present

## 2018-04-25 ENCOUNTER — Ambulatory Visit: Payer: BLUE CROSS/BLUE SHIELD | Admitting: Psychology

## 2018-04-25 DIAGNOSIS — F331 Major depressive disorder, recurrent, moderate: Secondary | ICD-10-CM

## 2018-05-08 ENCOUNTER — Ambulatory Visit: Payer: BLUE CROSS/BLUE SHIELD | Admitting: Psychology

## 2018-05-08 DIAGNOSIS — F331 Major depressive disorder, recurrent, moderate: Secondary | ICD-10-CM

## 2018-05-22 ENCOUNTER — Ambulatory Visit: Payer: BLUE CROSS/BLUE SHIELD | Admitting: Psychology

## 2018-05-22 DIAGNOSIS — F331 Major depressive disorder, recurrent, moderate: Secondary | ICD-10-CM | POA: Diagnosis not present

## 2018-06-04 ENCOUNTER — Ambulatory Visit: Payer: BLUE CROSS/BLUE SHIELD | Admitting: Psychology

## 2018-06-04 DIAGNOSIS — F331 Major depressive disorder, recurrent, moderate: Secondary | ICD-10-CM

## 2018-06-19 ENCOUNTER — Ambulatory Visit: Payer: BLUE CROSS/BLUE SHIELD | Admitting: Psychology

## 2018-06-19 DIAGNOSIS — F331 Major depressive disorder, recurrent, moderate: Secondary | ICD-10-CM | POA: Diagnosis not present

## 2018-07-03 ENCOUNTER — Ambulatory Visit: Payer: BLUE CROSS/BLUE SHIELD | Admitting: Psychology

## 2018-07-03 DIAGNOSIS — F331 Major depressive disorder, recurrent, moderate: Secondary | ICD-10-CM | POA: Diagnosis not present

## 2018-07-17 ENCOUNTER — Ambulatory Visit: Payer: BLUE CROSS/BLUE SHIELD | Admitting: Psychology

## 2018-07-17 DIAGNOSIS — F331 Major depressive disorder, recurrent, moderate: Secondary | ICD-10-CM | POA: Diagnosis not present

## 2018-07-31 ENCOUNTER — Ambulatory Visit: Payer: BLUE CROSS/BLUE SHIELD | Admitting: Psychology

## 2018-07-31 DIAGNOSIS — F331 Major depressive disorder, recurrent, moderate: Secondary | ICD-10-CM | POA: Diagnosis not present

## 2018-08-13 ENCOUNTER — Ambulatory Visit (INDEPENDENT_AMBULATORY_CARE_PROVIDER_SITE_OTHER): Payer: BLUE CROSS/BLUE SHIELD | Admitting: Psychology

## 2018-08-13 DIAGNOSIS — F331 Major depressive disorder, recurrent, moderate: Secondary | ICD-10-CM | POA: Diagnosis not present

## 2018-08-27 ENCOUNTER — Ambulatory Visit (INDEPENDENT_AMBULATORY_CARE_PROVIDER_SITE_OTHER): Payer: BLUE CROSS/BLUE SHIELD | Admitting: Psychology

## 2018-08-27 DIAGNOSIS — F331 Major depressive disorder, recurrent, moderate: Secondary | ICD-10-CM | POA: Diagnosis not present

## 2018-09-10 ENCOUNTER — Ambulatory Visit (INDEPENDENT_AMBULATORY_CARE_PROVIDER_SITE_OTHER): Payer: BLUE CROSS/BLUE SHIELD | Admitting: Psychology

## 2018-09-10 DIAGNOSIS — F331 Major depressive disorder, recurrent, moderate: Secondary | ICD-10-CM

## 2018-09-17 ENCOUNTER — Ambulatory Visit (INDEPENDENT_AMBULATORY_CARE_PROVIDER_SITE_OTHER): Payer: BLUE CROSS/BLUE SHIELD | Admitting: Psychology

## 2018-09-17 DIAGNOSIS — F331 Major depressive disorder, recurrent, moderate: Secondary | ICD-10-CM | POA: Diagnosis not present

## 2018-09-26 ENCOUNTER — Ambulatory Visit: Payer: BLUE CROSS/BLUE SHIELD | Admitting: Psychology

## 2018-09-26 DIAGNOSIS — F331 Major depressive disorder, recurrent, moderate: Secondary | ICD-10-CM

## 2018-10-10 ENCOUNTER — Ambulatory Visit: Payer: BLUE CROSS/BLUE SHIELD | Admitting: Psychology

## 2018-10-10 DIAGNOSIS — F331 Major depressive disorder, recurrent, moderate: Secondary | ICD-10-CM | POA: Diagnosis not present

## 2018-10-23 ENCOUNTER — Ambulatory Visit: Payer: BLUE CROSS/BLUE SHIELD | Admitting: Psychology

## 2018-10-26 ENCOUNTER — Ambulatory Visit (INDEPENDENT_AMBULATORY_CARE_PROVIDER_SITE_OTHER): Payer: BLUE CROSS/BLUE SHIELD | Admitting: Psychology

## 2018-10-26 DIAGNOSIS — F331 Major depressive disorder, recurrent, moderate: Secondary | ICD-10-CM

## 2018-11-06 ENCOUNTER — Ambulatory Visit: Payer: BLUE CROSS/BLUE SHIELD | Admitting: Psychology

## 2018-11-09 ENCOUNTER — Ambulatory Visit: Payer: BLUE CROSS/BLUE SHIELD | Admitting: Psychology

## 2018-11-14 ENCOUNTER — Ambulatory Visit: Payer: BLUE CROSS/BLUE SHIELD | Admitting: Psychology

## 2018-11-20 ENCOUNTER — Ambulatory Visit (INDEPENDENT_AMBULATORY_CARE_PROVIDER_SITE_OTHER): Payer: BLUE CROSS/BLUE SHIELD | Admitting: Psychology

## 2018-11-20 DIAGNOSIS — F331 Major depressive disorder, recurrent, moderate: Secondary | ICD-10-CM | POA: Diagnosis not present

## 2018-12-04 ENCOUNTER — Ambulatory Visit (INDEPENDENT_AMBULATORY_CARE_PROVIDER_SITE_OTHER): Payer: BLUE CROSS/BLUE SHIELD | Admitting: Psychology

## 2018-12-04 DIAGNOSIS — F331 Major depressive disorder, recurrent, moderate: Secondary | ICD-10-CM

## 2018-12-18 ENCOUNTER — Ambulatory Visit (INDEPENDENT_AMBULATORY_CARE_PROVIDER_SITE_OTHER): Payer: BLUE CROSS/BLUE SHIELD | Admitting: Psychology

## 2018-12-18 DIAGNOSIS — F331 Major depressive disorder, recurrent, moderate: Secondary | ICD-10-CM

## 2019-01-01 ENCOUNTER — Ambulatory Visit (INDEPENDENT_AMBULATORY_CARE_PROVIDER_SITE_OTHER): Payer: BLUE CROSS/BLUE SHIELD | Admitting: Psychology

## 2019-01-01 DIAGNOSIS — F331 Major depressive disorder, recurrent, moderate: Secondary | ICD-10-CM | POA: Diagnosis not present

## 2019-01-16 ENCOUNTER — Ambulatory Visit (INDEPENDENT_AMBULATORY_CARE_PROVIDER_SITE_OTHER): Payer: BLUE CROSS/BLUE SHIELD | Admitting: Psychology

## 2019-01-16 DIAGNOSIS — F331 Major depressive disorder, recurrent, moderate: Secondary | ICD-10-CM | POA: Diagnosis not present

## 2019-01-28 ENCOUNTER — Ambulatory Visit (INDEPENDENT_AMBULATORY_CARE_PROVIDER_SITE_OTHER): Payer: BLUE CROSS/BLUE SHIELD | Admitting: Psychology

## 2019-01-28 DIAGNOSIS — F331 Major depressive disorder, recurrent, moderate: Secondary | ICD-10-CM

## 2019-02-12 ENCOUNTER — Ambulatory Visit (INDEPENDENT_AMBULATORY_CARE_PROVIDER_SITE_OTHER): Payer: BLUE CROSS/BLUE SHIELD | Admitting: Psychology

## 2019-02-12 DIAGNOSIS — F331 Major depressive disorder, recurrent, moderate: Secondary | ICD-10-CM | POA: Diagnosis not present

## 2019-02-26 ENCOUNTER — Ambulatory Visit (INDEPENDENT_AMBULATORY_CARE_PROVIDER_SITE_OTHER): Payer: BLUE CROSS/BLUE SHIELD | Admitting: Psychology

## 2019-02-26 DIAGNOSIS — F331 Major depressive disorder, recurrent, moderate: Secondary | ICD-10-CM

## 2019-03-05 ENCOUNTER — Ambulatory Visit (INDEPENDENT_AMBULATORY_CARE_PROVIDER_SITE_OTHER): Payer: BLUE CROSS/BLUE SHIELD | Admitting: Psychology

## 2019-03-05 DIAGNOSIS — F331 Major depressive disorder, recurrent, moderate: Secondary | ICD-10-CM | POA: Diagnosis not present

## 2019-03-12 ENCOUNTER — Ambulatory Visit: Payer: BLUE CROSS/BLUE SHIELD | Admitting: Psychology

## 2019-03-26 ENCOUNTER — Ambulatory Visit (INDEPENDENT_AMBULATORY_CARE_PROVIDER_SITE_OTHER): Payer: BLUE CROSS/BLUE SHIELD | Admitting: Psychology

## 2019-03-26 DIAGNOSIS — F331 Major depressive disorder, recurrent, moderate: Secondary | ICD-10-CM

## 2019-03-27 ENCOUNTER — Other Ambulatory Visit (HOSPITAL_COMMUNITY): Payer: BLUE CROSS/BLUE SHIELD | Attending: Psychiatry | Admitting: Psychiatry

## 2019-03-27 ENCOUNTER — Other Ambulatory Visit: Payer: Self-pay

## 2019-03-27 DIAGNOSIS — F4312 Post-traumatic stress disorder, chronic: Secondary | ICD-10-CM | POA: Insufficient documentation

## 2019-03-27 DIAGNOSIS — Z560 Unemployment, unspecified: Secondary | ICD-10-CM | POA: Insufficient documentation

## 2019-03-27 DIAGNOSIS — R251 Tremor, unspecified: Secondary | ICD-10-CM | POA: Insufficient documentation

## 2019-03-27 DIAGNOSIS — R454 Irritability and anger: Secondary | ICD-10-CM | POA: Insufficient documentation

## 2019-03-27 DIAGNOSIS — Z79899 Other long term (current) drug therapy: Secondary | ICD-10-CM | POA: Insufficient documentation

## 2019-03-27 DIAGNOSIS — F3341 Major depressive disorder, recurrent, in partial remission: Secondary | ICD-10-CM | POA: Insufficient documentation

## 2019-03-27 DIAGNOSIS — F16983 Hallucinogen use, unspecified with hallucinogen persisting perception disorder (flashbacks): Secondary | ICD-10-CM | POA: Insufficient documentation

## 2019-03-27 DIAGNOSIS — Z7901 Long term (current) use of anticoagulants: Secondary | ICD-10-CM | POA: Insufficient documentation

## 2019-03-27 DIAGNOSIS — Z86711 Personal history of pulmonary embolism: Secondary | ICD-10-CM | POA: Insufficient documentation

## 2019-03-27 DIAGNOSIS — R5383 Other fatigue: Secondary | ICD-10-CM | POA: Insufficient documentation

## 2019-03-27 DIAGNOSIS — F419 Anxiety disorder, unspecified: Secondary | ICD-10-CM | POA: Insufficient documentation

## 2019-03-27 DIAGNOSIS — F430 Acute stress reaction: Secondary | ICD-10-CM | POA: Insufficient documentation

## 2019-03-27 DIAGNOSIS — N183 Chronic kidney disease, stage 3 (moderate): Secondary | ICD-10-CM | POA: Insufficient documentation

## 2019-03-27 DIAGNOSIS — J45909 Unspecified asthma, uncomplicated: Secondary | ICD-10-CM | POA: Insufficient documentation

## 2019-03-27 DIAGNOSIS — E119 Type 2 diabetes mellitus without complications: Secondary | ICD-10-CM | POA: Insufficient documentation

## 2019-03-27 DIAGNOSIS — D6862 Lupus anticoagulant syndrome: Secondary | ICD-10-CM | POA: Insufficient documentation

## 2019-03-27 NOTE — Progress Notes (Signed)
Virtual Visit via Telephone Note  I connected with Allison [name] on 03/27/19 at 1100 by telephone and verified that I am speaking with the correct person using two identifiers.  I discussed the limitations, risks, security and privacy concerns of performing an evaluation and management service by telephone and the availability of in person appointments. I also discussed with the patient that there may be a patient responsible charge related to this service. The patient expressed understanding and agreed to proceed. I discussed the assessment and treatment plan with the patient. The patient was provided an opportunity to ask questions and all were answered. The patient agreed with the plan and demonstrated an understanding of the instructions.  The patient was advised to call back or seek an in-person evaluation if the symptoms worsen or if the condition fails to improve as anticipated.  I provided 60 minutes of non-face-to-face time during this encounter.         Carlis Abbott, RITA, M.Ed,CNA
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
## 2019-03-28 ENCOUNTER — Other Ambulatory Visit: Payer: Self-pay

## 2019-03-28 ENCOUNTER — Encounter (HOSPITAL_COMMUNITY): Payer: Self-pay

## 2019-03-28 ENCOUNTER — Other Ambulatory Visit (HOSPITAL_COMMUNITY): Payer: BLUE CROSS/BLUE SHIELD | Admitting: Psychiatry

## 2019-03-28 ENCOUNTER — Ambulatory Visit: Payer: BLUE CROSS/BLUE SHIELD | Admitting: Psychology

## 2019-03-28 DIAGNOSIS — F430 Acute stress reaction: Secondary | ICD-10-CM | POA: Diagnosis not present

## 2019-03-28 DIAGNOSIS — F3341 Major depressive disorder, recurrent, in partial remission: Secondary | ICD-10-CM

## 2019-03-28 DIAGNOSIS — Z86711 Personal history of pulmonary embolism: Secondary | ICD-10-CM | POA: Diagnosis not present

## 2019-03-28 DIAGNOSIS — F4312 Post-traumatic stress disorder, chronic: Secondary | ICD-10-CM

## 2019-03-28 DIAGNOSIS — Z560 Unemployment, unspecified: Secondary | ICD-10-CM | POA: Diagnosis not present

## 2019-03-28 DIAGNOSIS — J45909 Unspecified asthma, uncomplicated: Secondary | ICD-10-CM | POA: Diagnosis not present

## 2019-03-28 DIAGNOSIS — R5383 Other fatigue: Secondary | ICD-10-CM | POA: Diagnosis not present

## 2019-03-28 DIAGNOSIS — D6862 Lupus anticoagulant syndrome: Secondary | ICD-10-CM | POA: Diagnosis not present

## 2019-03-28 DIAGNOSIS — N183 Chronic kidney disease, stage 3 (moderate): Secondary | ICD-10-CM | POA: Diagnosis not present

## 2019-03-28 DIAGNOSIS — Z7901 Long term (current) use of anticoagulants: Secondary | ICD-10-CM | POA: Diagnosis not present

## 2019-03-28 DIAGNOSIS — F419 Anxiety disorder, unspecified: Secondary | ICD-10-CM | POA: Diagnosis not present

## 2019-03-28 DIAGNOSIS — Z79899 Other long term (current) drug therapy: Secondary | ICD-10-CM | POA: Diagnosis not present

## 2019-03-28 DIAGNOSIS — R454 Irritability and anger: Secondary | ICD-10-CM | POA: Diagnosis not present

## 2019-03-28 DIAGNOSIS — R251 Tremor, unspecified: Secondary | ICD-10-CM | POA: Diagnosis not present

## 2019-03-28 DIAGNOSIS — E119 Type 2 diabetes mellitus without complications: Secondary | ICD-10-CM | POA: Diagnosis not present

## 2019-03-28 DIAGNOSIS — F16983 Hallucinogen use, unspecified with hallucinogen persisting perception disorder (flashbacks): Secondary | ICD-10-CM | POA: Diagnosis not present
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
## 2019-03-29 ENCOUNTER — Other Ambulatory Visit: Payer: Self-pay

## 2019-03-29 ENCOUNTER — Other Ambulatory Visit (HOSPITAL_COMMUNITY): Payer: BLUE CROSS/BLUE SHIELD | Admitting: Family

## 2019-03-29 ENCOUNTER — Encounter (HOSPITAL_COMMUNITY): Payer: Self-pay | Admitting: Family

## 2019-03-29 DIAGNOSIS — F3341 Major depressive disorder, recurrent, in partial remission: Secondary | ICD-10-CM | POA: Diagnosis not present

## 2019-03-29 NOTE — Progress Notes (Signed)
Virtual Visit via Video Note  I connected with Allison [name] on 03/29/19 at  9:00 AM EDT by a video enabled telemedicine application and verified that I am speaking with the correct person using two identifiers.  Location: Patient: Allison [name] Provider: Lise Auer, LCSW   I discussed the limitations of evaluation and management by telemedicine and the availability of in person appointments. The patient expressed understanding and agreed to proceed.  History of Present Illness: Chronic PTSD and MDD   Observations/Objective: Case Manager checked in with all participants to review discharge dates, insurance authorizations, work-related documents and needs for the treatment team. Counselor processed current mood and functioning and discussed how participants spent their time since last session and if skills were applied. Allison [name] reported having a rough day after group related to her family's rules about interacting due to [place]. She stated that she "cried a lot", but she was able to call to talk with a friend who is a Theme park manager and to pray. She reported that being very helpful in helping her cope better. Counselor covered the topic of "Anxiety Awareness and Management" in today's session. Counselor promoted participants to journal about their anxiety, how it presents, frequency, duration, initiation, triggers and how they feel it in their bodies. Each participant shared their reflections. She reported worries, fears, helplessness, flashbacks, panic attacks and feeling like she has no control. We watched a brief video called, "The Science of Anxiety" to provide psychoeducation about what happens in the body during times of anxiety or anxiety disorders. Counselor then shared a handout called, "The Cycle of Anxiety", which includes avoidance, temporary stress relief, but creates long-term anxiety growth. Each participant shared their thoughts on the handout and how it relates to their anxiety.  Allison [name] related to the avoidance and the negative impacts this has in her life. Counselor shared a TedEx Talk on "Ways to Cope with Anxiety", which shared research on effective ways to reduce and better deal with anxiety. Group members shared their reactions to the video and discussed ways they could implement the skills. Allison [name] would like to work on forgiving herself more and being kinder to herself when she makes mistakes. Counselor provided another worksheet called, "Worry Exploration" and walked participants through the activity of questioning their worrying thoughts to think more rationally. Group members shared their responses and how they could apply this in the future. Allison [name] shared about her worry regarding her father's terminal condition and their relationship. Counselor shared local resources to assist. Counselor acknowledge the progress of a graduating member and allowed time for the group to share encouraging words on her final day.   Assessment and Plan: Counselor recommends that patient remains in IOP treatment to better manage mental health symptoms and continue to address treatment plan goals. Counselor recommends adherence to crisis/safety plan, taking medications as prescribed and following up with medical professionals if any issues arise.   Follow Up Instructions: Counselor will send Webex link for next session.    I discussed the assessment and treatment plan with the patient. The patient was provided an opportunity to ask questions and all were answered. The patient agreed with the plan and demonstrated an understanding of the instructions.   The patient was advised to call back or seek an in-person evaluation if the symptoms worsen or if the condition fails to improve as anticipated.  I provided 180 minutes of non-face-to-face time during this encounter.   Lise Auer, LCSW
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
## 2019-03-31 ENCOUNTER — Encounter (HOSPITAL_COMMUNITY): Payer: Self-pay | Admitting: Family

## 2019-04-01 ENCOUNTER — Other Ambulatory Visit (HOSPITAL_COMMUNITY): Payer: BLUE CROSS/BLUE SHIELD | Admitting: Psychiatry

## 2019-04-01 ENCOUNTER — Other Ambulatory Visit: Payer: Self-pay

## 2019-04-01 ENCOUNTER — Encounter (HOSPITAL_COMMUNITY): Payer: Self-pay | Admitting: Psychiatry

## 2019-04-01 DIAGNOSIS — F3341 Major depressive disorder, recurrent, in partial remission: Secondary | ICD-10-CM

## 2019-04-01 DIAGNOSIS — F4312 Post-traumatic stress disorder, chronic: Secondary | ICD-10-CM

## 2019-04-01 NOTE — Progress Notes (Signed)
Virtual Visit via Video Note  I connected with [name] on 04/01/19 at  9:00 AM EDT by a video enabled telemedicine application and verified that I am speaking with the correct person using two identifiers.  Location: Patient: Allison [name] Provider: Lise Auer, LCSW   I discussed the limitations of evaluation and management by telemedicine and the availability of in person appointments. The patient expressed understanding and agreed to proceed.  History of Present Illness: MDD and Chronic PTSD   Observations/Objective: Case Manager checked in with all participants to review discharge dates, insurance authorizations, work-related documents and needs for the treatment team. Counselor processed current mood and functioning and discussed how participants spent their time since last session and if skills were applied. Willow shared that she rested most of the weekend, as she continues to recover from Gerty. She reported being sad about not being able to attend her church or be close with her grandchildren and sons.  Counselor engaged the group in an ice breaker to introduce each other to the new group members and for future planning. Counselor engaged group in an activity, challenging them to write down the most emotion words they could come up with in 1 minute. Careli came up with 8 feelings words that were a good variety.  Each group member shared the words they jotted down. Counselor shared psychoeducation on the skill and ability to label and identify your emotions for better mental health and connections with others. Counselor provided group members with an assessment entitled, "Emotional Intelligence". Each group member took and scored their assessment, then we reviewed the results. Milinda was having technical difficulties, so she was unable to do the assessment on the call, but she was listening to others reporting. Counselor provided a brief video to explain the importance of  emotional intelligence and received feedback from group members. Counselor provided group members with material on the components of emotional intelligence and provided psychoeducation around the points while encouraging group members to take note of areas for growth and short-term goals they can begin to improve their emotional intelligence. Kay would like to work on Scientist, forensic. Counselor wrapped up group with allowing all to share their plans for the afternoon, self-care plan or productivity activity. Amazing plans to do house work, take a nap and spend time walking with her husband.   Assessment and Plan: Counselor recommends that patient remains in IOP treatment to better manage mental health symptoms and continue to address treatment plan goals. Counselor recommends adherence to crisis/safety plan, taking medications as prescribed and following up with medical professionals if any issues arise.   Follow Up Instructions: Counselor will send Webex link for next session.    I discussed the assessment and treatment plan with the patient. The patient was provided an opportunity to ask questions and all were answered. The patient agreed with the plan and demonstrated an understanding of the instructions.   The patient was advised to call back or seek an in-person evaluation if the symptoms worsen or if the condition fails to improve as anticipated.  I provided 180 minutes of non-face-to-face time during this encounter.   Lise Auer, LCSW

## 2019-04-02 ENCOUNTER — Other Ambulatory Visit (HOSPITAL_COMMUNITY): Payer: BLUE CROSS/BLUE SHIELD | Admitting: Psychiatry

## 2019-04-02 ENCOUNTER — Other Ambulatory Visit: Payer: Self-pay

## 2019-04-02 DIAGNOSIS — F3341 Major depressive disorder, recurrent, in partial remission: Secondary | ICD-10-CM | POA: Diagnosis not present

## 2019-04-02 DIAGNOSIS — F4312 Post-traumatic stress disorder, chronic: Secondary | ICD-10-CM

## 2019-04-03 ENCOUNTER — Encounter (HOSPITAL_COMMUNITY): Payer: Self-pay | Admitting: Psychiatry

## 2019-04-03 ENCOUNTER — Other Ambulatory Visit: Payer: Self-pay

## 2019-04-03 ENCOUNTER — Other Ambulatory Visit (HOSPITAL_COMMUNITY): Payer: BLUE CROSS/BLUE SHIELD | Admitting: Psychiatry

## 2019-04-03 DIAGNOSIS — F4312 Post-traumatic stress disorder, chronic: Secondary | ICD-10-CM

## 2019-04-03 DIAGNOSIS — F3341 Major depressive disorder, recurrent, in partial remission: Secondary | ICD-10-CM

## 2019-04-03 NOTE — Progress Notes (Signed)
Virtual Visit via Video Note  I connected with Allison [name] on 04/03/19 at  9:00 AM EDT by a video enabled telemedicine application and verified that I am speaking with the correct person using two identifiers.  Location: Patient: Allison [name] Provider: Lise Auer, LCSW   I discussed the limitations of evaluation and management by telemedicine and the availability of in person appointments. The patient expressed understanding and agreed to proceed.  History of Present Illness: MDD and Chronic PTSD   Observations/Objective: Case Manager checked in with all participants to review discharge dates, insurance authorizations, work-related documents and needs for the treatment team. Counselor introduced our guest speaker, Einar Grad, [facility] Pharmacist, who shared about psychiatric medications, side effects, treatment considerations and how to communicate with medical professionals. Each group member asked questions and shared medication concerns. Counselor checked in with all group members to assess progress, coping skills used and treatment needs. Allison [name] reported on her doctors appointment and shared that she will probably need to get a kidney biopsy. She is very concerned with her eating patterns during the night-up 4-6 times eating sugary foods in an uncontrollable way. Counselor prompted following up with medical providers to address these behaviors.  Counselor allowed time for group members to do a body scan to document concerns and questions they may have for their medical providers. Group members shared their reflections with the group. Allison [name] would like to address her migranes differently as well as other chronic illnesses. Counselor shared psychoeducation on Self-Esteem via a video entitled, "The 6 Pillars of Self-Esteem: Understanding and Fixing It". Counselor prompted each member to share which area they would like to focus development.  Counselor provided 4 worksheets related to  self-esteem for group members to complete on their own time for more work and exploration in this area. Counselor wrapped up group and had everyone share a self-care or productivity task can plan for today. Allison [name] plans to rest this afternoon and eat a good meal.   Assessment and Plan: Counselor recommends that patient remains in IOP treatment to better manage mental health symptoms and continue to address treatment plan goals. Counselor recommends adherence to crisis/safety plan, taking medications as prescribed and following up with medical professionals if any issues arise.   Follow Up Instructions: Counselor will send Webex link for next session.    I discussed the assessment and treatment plan with the patient. The patient was provided an opportunity to ask questions and all were answered. The patient agreed with the plan and demonstrated an understanding of the instructions.   The patient was advised to call back or seek an in-person evaluation if the symptoms worsen or if the condition fails to improve as anticipated.  I provided 180 minutes of non-face-to-face time during this encounter.   Lise Auer, LCSW
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
## 2019-04-04 ENCOUNTER — Other Ambulatory Visit (HOSPITAL_COMMUNITY): Payer: BLUE CROSS/BLUE SHIELD | Admitting: Psychiatry

## 2019-04-04 ENCOUNTER — Encounter (HOSPITAL_COMMUNITY): Payer: Self-pay

## 2019-04-04 ENCOUNTER — Other Ambulatory Visit: Payer: Self-pay

## 2019-04-04 DIAGNOSIS — F3341 Major depressive disorder, recurrent, in partial remission: Secondary | ICD-10-CM

## 2019-04-04 DIAGNOSIS — F4312 Post-traumatic stress disorder, chronic: Secondary | ICD-10-CM

## 2019-04-04 NOTE — Progress Notes (Signed)
Virtual Visit via Video Note  I connected with Allison [name] on 04/04/19 at  9:00 AM EDT by a video enabled telemedicine application and verified that I am speaking with the correct person using two identifiers.  Location: Patient: Allison [name] Provider: Lise Auer, LCSW   I discussed the limitations of evaluation and management by telemedicine and the availability of in person appointments. The patient expressed understanding and agreed to proceed.  History of Present Illness: MDD and Chronic PTSD    Observations/Objective: Case Manager checked in with all participants to review discharge dates, insurance authorizations, work-related documents and needs for the treatment team. Counselor introduced guest speaker, Allison [name], [place], to facilitate a discussion around Grief and Loss topics. Payal opened up aboout losses in her life and how it impacts her emotionally. Counselor prompted the group to journal about the G&L issues they would like to further process with their individual therapist. Counselor engaged the group in ice breaker and checking in on mood, functioning and use of coping skills. Eddie reported resting and having a relaxed day. She sees herself supporting and enjoying time with her grandchildren and sons in the coming years. Counselor shared psychoeducation on panic attacks and explored what panic attacks look like for each individual. Minna shared about her specific experiences and attempts to control attacks. Counselor facilitated a guided imagery entitled, "Relaxation for Dealing with Anger." All members participated and shared feedback on their experience. Titiana stated the relaxation made her feel very calm and sleepy. Counselor closed by having each share what their plans are for the evening and with words of encouragement for a graduating member. Mishika plans to take a nap and spend time with her husband and grandchildren today.   Assessment  and Plan: Counselor recommends that patient remains in IOP treatment to better manage mental health symptoms and continue to address treatment plan goals. Counselor recommends adherence to crisis/safety plan, taking medications as prescribed and following up with medical professionals if any issues arise.   Follow Up Instructions: Counselor will send Webex link for next session.    I discussed the assessment and treatment plan with the patient. The patient was provided an opportunity to ask questions and all were answered. The patient agreed with the plan and demonstrated an understanding of the instructions.   The patient was advised to call back or seek an in-person evaluation if the symptoms worsen or if the condition fails to improve as anticipated.  I provided 180 minutes of non-face-to-face time during this encounter.   Lise Auer, LCSW

## 2019-04-05 ENCOUNTER — Other Ambulatory Visit: Payer: Self-pay

## 2019-04-05 ENCOUNTER — Other Ambulatory Visit (HOSPITAL_COMMUNITY): Payer: BLUE CROSS/BLUE SHIELD | Admitting: Psychiatry

## 2019-04-05 ENCOUNTER — Encounter (HOSPITAL_COMMUNITY): Payer: Self-pay

## 2019-04-05 DIAGNOSIS — F3341 Major depressive disorder, recurrent, in partial remission: Secondary | ICD-10-CM | POA: Diagnosis not present

## 2019-04-05 DIAGNOSIS — F4312 Post-traumatic stress disorder, chronic: Secondary | ICD-10-CM

## 2019-04-05 NOTE — Progress Notes (Signed)
Virtual Visit via Video Note  I connected with Allison [name] on 04/05/19 at  9:00 AM EDT by a video enabled telemedicine application and verified that I am speaking with the correct person using two identifiers.  Location: Patient: Allison [name] Provider: Lise Auer, LCSW   I discussed the limitations of evaluation and management by telemedicine and the availability of in person appointments. The patient expressed understanding and agreed to proceed.  History of Present Illness: MDD and Chronic PTSD   Observations/Objective: Case Manager checked in with all participants to review discharge dates, insurance authorizations, work-related documents and needs for the treatment team. Counselor processed current mood and functioning and discussed how participants spent their time since last session and if skills were applied. Allison [name] shared that she felt hurt that her grandkids are entering a new stage of development where they are not as interested in the grandparents and more distracted by their peers. She shared that she was able to use communication skills with her husband to repair from an argument they had.  Counselor introduced the group to the concept of Cognitive Distortions and Cognitive Coping by doing an activity using their names as an acrostic, one version being positive attributes and the other being negative views of self. Group members shared responses and their thought process behind the words they chose to represent themselves. Allison [name] shared that most of her negative cognitions are rooted in her childhood experiences. She shared that he husband does a good job of encouraging her with positive words.  Counselor shared a psychoeducational video named, "15 Styles of Distorted Thinking" to increase vocabulary and understanding of the different ways people have cognitive distortions. Counselor allowed group members to process allowed the information shared on the video and a  handout with the definitions. Allison [name] connected with what others were sharing and reported being glad to know others are having the same struggles and symptoms as her. Counselor provided group members with 2 worksheets entitled, "Examining the Evidence" and "Socratic Thinking", 2 Cognitive Coping strategies to challenge distorted cognitions. Counselor encouraged group members to work on the handouts as homework to sort through negative and automatic thoughts they have been struggling with. Counselor wrapped up group with allowing all to share their plans for the weekend, self-care plan or productivity activity. Allison [name] plans to spend time with her grandchildren at the pool this weekend.   Assessment and Plan: Counselor recommends that patient remains in IOP treatment to better manage mental health symptoms and continue to address treatment plan goals. Counselor recommends adherence to crisis/safety plan, taking medications as prescribed and following up with medical professionals if any issues arise.   Follow Up Instructions: Counselor will send Webex link for next session.    I discussed the assessment and treatment plan with the patient. The patient was provided an opportunity to ask questions and all were answered. The patient agreed with the plan and demonstrated an understanding of the instructions.   The patient was advised to call back or seek an in-person evaluation if the symptoms worsen or if the condition fails to improve as anticipated.  I provided 180 minutes of non-face-to-face time during this encounter.   Lise Auer, LCSW

## 2019-04-08 ENCOUNTER — Encounter (HOSPITAL_COMMUNITY): Payer: Self-pay

## 2019-04-08 ENCOUNTER — Other Ambulatory Visit: Payer: Self-pay

## 2019-04-08 ENCOUNTER — Other Ambulatory Visit (HOSPITAL_COMMUNITY): Payer: BLUE CROSS/BLUE SHIELD | Admitting: Psychiatry

## 2019-04-08 DIAGNOSIS — F4312 Post-traumatic stress disorder, chronic: Secondary | ICD-10-CM

## 2019-04-08 DIAGNOSIS — F3341 Major depressive disorder, recurrent, in partial remission: Secondary | ICD-10-CM | POA: Diagnosis not present

## 2019-04-08 NOTE — Progress Notes (Signed)
Virtual Visit via Video Note  I connected with Allison [name] on 04/08/19 at  9:00 AM EDT by a video enabled telemedicine application and verified that I am speaking with the correct person using two identifiers.  Location: Patient: Allison [name] Provider: Lise Auer, LCSW   I discussed the limitations of evaluation and management by telemedicine and the availability of in person appointments. The patient expressed understanding and agreed to proceed.  History of Present Illness: MDD and Chronic PTSD   Observations/Objective: Case Manager checked in with all participants to review discharge dates, insurance authorizations, work-related documents and needs for the treatment team. Counselor processed current mood and functioning and discussed how participants spent their time since last session and if skills were applied. Allison [name] shared that she enjoyed attending church over the weekend and otherwise took it easy, as she continues to rebuild strength after a recent illness. She shared that she would like for [place] to discuss trauma and flashbacks specifically in future sessions. Counselor discussed the concept of "getting better at feeling vs feeling better." Group members assessed their abilities to do so. Counselor shared a psychoeducational video on the concept of "Willingness to Accept Feelings as the Come." Counselor received feedback from group members on the information and how they can apply this skill. Allison [name] improved her ability to feel and connect with her feelings. Counselor shared examples and gathered examples from the group about handling difficult and uncomfortable conversations, especially on the job. Group members discussed strategies.  Counselor provided the group with information on 8 breathing exercises to reduce anxiety, allowing group members to practice the skills in session.  Counselor wrapped up group with allowing all to share their plans for the day, self-care plan  or productivity activity. Allison [name] plans to take a nap due to the weather and work on returning paperwork for the program. Counselor to provide past and current handouts.   Assessment and Plan: Counselor recommends that patient remains in IOP treatment to better manage mental health symptoms and continue to address treatment plan goals. Counselor recommends adherence to crisis/safety plan, taking medications as prescribed and following up with medical professionals if any issues arise.   Follow Up Instructions: Counselor will send Webex link for next session.    I discussed the assessment and treatment plan with the patient. The patient was provided an opportunity to ask questions and all were answered. The patient agreed with the plan and demonstrated an understanding of the instructions.   The patient was advised to call back or seek an in-person evaluation if the symptoms worsen or if the condition fails to improve as anticipated.  I provided 180 minutes of non-face-to-face time during this encounter.   Lise Auer, LCSW

## 2019-04-09 ENCOUNTER — Other Ambulatory Visit (HOSPITAL_COMMUNITY): Payer: BLUE CROSS/BLUE SHIELD | Attending: Psychiatry | Admitting: Psychiatry

## 2019-04-09 ENCOUNTER — Other Ambulatory Visit: Payer: Self-pay

## 2019-04-09 ENCOUNTER — Encounter (HOSPITAL_COMMUNITY): Payer: Self-pay

## 2019-04-09 DIAGNOSIS — F329 Major depressive disorder, single episode, unspecified: Secondary | ICD-10-CM | POA: Diagnosis not present

## 2019-04-09 DIAGNOSIS — F419 Anxiety disorder, unspecified: Secondary | ICD-10-CM | POA: Insufficient documentation

## 2019-04-09 DIAGNOSIS — F4312 Post-traumatic stress disorder, chronic: Secondary | ICD-10-CM

## 2019-04-09 DIAGNOSIS — F3341 Major depressive disorder, recurrent, in partial remission: Secondary | ICD-10-CM | POA: Diagnosis present

## 2019-04-09 NOTE — Patient Instructions (Signed)
D:  Patient will complete MH-IOP tomorrow (04-10-19).  A:  Follow up with Dr. Daron Offer on 04-11-19 @ 11:30 a.m and Trey Paula, LCSW on 04-17-19 @ 12 noon.  Encouraged support groups.  R:  Patient receptive.
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
## 2019-04-10 ENCOUNTER — Encounter (HOSPITAL_COMMUNITY): Payer: Self-pay | Admitting: Family

## 2019-04-10 ENCOUNTER — Other Ambulatory Visit (HOSPITAL_COMMUNITY): Payer: BLUE CROSS/BLUE SHIELD | Admitting: Psychiatry

## 2019-04-10 ENCOUNTER — Other Ambulatory Visit: Payer: Self-pay

## 2019-04-10 DIAGNOSIS — F4312 Post-traumatic stress disorder, chronic: Secondary | ICD-10-CM

## 2019-04-10 DIAGNOSIS — F329 Major depressive disorder, single episode, unspecified: Secondary | ICD-10-CM | POA: Diagnosis not present

## 2019-04-10 DIAGNOSIS — F3341 Major depressive disorder, recurrent, in partial remission: Secondary | ICD-10-CM
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
## 2019-04-10 NOTE — Progress Notes (Signed)
  Virtual Visit via Video Note  I connected with Allison [name] on 04/10/19 at 0830 by a video enabled telemedicine application and verified that I am speaking with the correct person using two identifiers. I discussed the limitations of evaluation and management by telemedicine and the availability of in person appointments. The patient expressed understanding and agreed to proceed. I discussed the assessment and treatment plan with the patient. The patient was provided an opportunity to ask questions and all were answered. The patient agreed with the plan and demonstrated an understanding of the instructions. The patient was advised to call back or seek an in-person evaluation if the symptoms worsen or if the condition fails to improve as anticipated.  I provided 25 minutes of non-face-to-face time during this encounter.  As per previous CCA states: This is a 58 yr old, married, unemployed, Caucasian female; who was a self referral; treatment for worsening depressive and anxiety (panic) symptoms.  Admits to worsening sx's since COVID-19.  Stressors:  1)  Pt states she and husband of 23 yrs marriage both had COVID-19.  "We just recently came out of quarantine.  It affected my husband worse.  He started having heart issues."  Pt states it affected she and her husband kidneys.  2)  Unresolved grief/loss issues:  States father has cancer and she hasn't been able to visit him d/t COVID-19.  3)  Flashbacks from past childhood trauma.  4)  Medical Issues:  Stage 3 kidney failure, migraines, tremors.  States she's currently under the care of a neurologist (Dr. Tressa Busman).  Pt admits to one prior psychiatric hospitalization ([facility] in 2014 d/t OD).  Has been seeing Dr. Daron Offer for ~ one yr and Trey Paula, [place] for 9 yrs.  Family hx:  Maternal Uncle and Twin Sister  (Depression and Anxiety).  Pt attended all scheduled days in [place].  Pt was active in all the groups.  States the groups were helpful.  Reports  that her depression and anxiety has "lifted."  Continues to c/o flashbacks.  Denies SI/HI or A/V hallucinations. States she will be seeing an nutritionist on 05-14-19. A:  D/C today.  F/U with Dr. Daron Offer on 04-11-19 @ 11:30 a.m and Trey Paula, LCSW on 04-17-19 @ 12 pm.  Encouraged support groups  Provided pt with [facility] [place] information.  R:  Patient receptive.       Carlis Abbott, RITA, M.Ed, CNA
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
## 2019-04-11 ENCOUNTER — Encounter (HOSPITAL_COMMUNITY): Payer: Self-pay | Admitting: Psychiatry

## 2019-04-11 ENCOUNTER — Other Ambulatory Visit (HOSPITAL_COMMUNITY): Payer: BLUE CROSS/BLUE SHIELD

## 2019-04-11 ENCOUNTER — Other Ambulatory Visit: Payer: Self-pay

## 2019-04-12 ENCOUNTER — Other Ambulatory Visit (HOSPITAL_COMMUNITY): Payer: BLUE CROSS/BLUE SHIELD

## 2019-04-16 ENCOUNTER — Other Ambulatory Visit (HOSPITAL_COMMUNITY): Payer: BLUE CROSS/BLUE SHIELD

## 2019-04-17 ENCOUNTER — Ambulatory Visit (INDEPENDENT_AMBULATORY_CARE_PROVIDER_SITE_OTHER): Payer: BLUE CROSS/BLUE SHIELD | Admitting: Psychology

## 2019-04-17 ENCOUNTER — Other Ambulatory Visit (HOSPITAL_COMMUNITY): Payer: BLUE CROSS/BLUE SHIELD

## 2019-04-17 DIAGNOSIS — F331 Major depressive disorder, recurrent, moderate: Secondary | ICD-10-CM | POA: Diagnosis not present

## 2019-04-18 ENCOUNTER — Other Ambulatory Visit (HOSPITAL_COMMUNITY): Payer: BLUE CROSS/BLUE SHIELD

## 2019-05-01 ENCOUNTER — Ambulatory Visit: Payer: BLUE CROSS/BLUE SHIELD | Admitting: Psychology

## 2019-05-14 ENCOUNTER — Ambulatory Visit: Payer: BLUE CROSS/BLUE SHIELD | Admitting: Psychology

## 2020-08-17 ENCOUNTER — Other Ambulatory Visit (HOSPITAL_COMMUNITY): Payer: Self-pay | Admitting: Psychiatry
